# Patient Record
Sex: Female | Born: 1955 | Race: White | Hispanic: No | Marital: Married | Smoking: Former smoker
Health system: Southern US, Community
[De-identification: ages and names within clinical notes are randomized; demographics above are authoritative.]

## PROBLEM LIST (undated history)

## (undated) DIAGNOSIS — E7800 Pure hypercholesterolemia, unspecified: Secondary | ICD-10-CM

## (undated) DIAGNOSIS — E669 Obesity, unspecified: Secondary | ICD-10-CM

## (undated) DIAGNOSIS — I10 Essential (primary) hypertension: Secondary | ICD-10-CM

## (undated) DIAGNOSIS — G459 Transient cerebral ischemic attack, unspecified: Secondary | ICD-10-CM

## (undated) DIAGNOSIS — E119 Type 2 diabetes mellitus without complications: Secondary | ICD-10-CM

## (undated) HISTORY — PX: FINGER SURGERY: SHX640

## (undated) HISTORY — PX: TONSILLECTOMY AND ADENOIDECTOMY: SHX28

## (undated) HISTORY — DX: Type 2 diabetes mellitus without complications: E11.9

## (undated) HISTORY — DX: Pure hypercholesterolemia, unspecified: E78.00

---

## 1997-12-20 ENCOUNTER — Inpatient Hospital Stay (HOSPITAL_COMMUNITY): Admission: EM | Admit: 1997-12-20 | Discharge: 1997-12-22 | Payer: Self-pay | Admitting: Emergency Medicine

## 1998-12-18 ENCOUNTER — Other Ambulatory Visit: Admission: RE | Admit: 1998-12-18 | Discharge: 1998-12-18 | Payer: Self-pay | Admitting: Emergency Medicine

## 2001-01-09 ENCOUNTER — Other Ambulatory Visit: Admission: RE | Admit: 2001-01-09 | Discharge: 2001-01-09 | Payer: Self-pay | Admitting: Emergency Medicine

## 2001-01-14 ENCOUNTER — Encounter: Admission: RE | Admit: 2001-01-14 | Discharge: 2001-01-14 | Payer: Self-pay | Admitting: Emergency Medicine

## 2001-01-14 ENCOUNTER — Encounter: Payer: Self-pay | Admitting: Emergency Medicine

## 2003-04-20 ENCOUNTER — Emergency Department (HOSPITAL_COMMUNITY): Admission: EM | Admit: 2003-04-20 | Discharge: 2003-04-20 | Payer: Self-pay | Admitting: Emergency Medicine

## 2003-11-30 ENCOUNTER — Encounter: Admission: RE | Admit: 2003-11-30 | Discharge: 2003-11-30 | Payer: Self-pay | Admitting: Emergency Medicine

## 2005-08-07 ENCOUNTER — Encounter: Payer: Self-pay | Admitting: Emergency Medicine

## 2005-10-02 ENCOUNTER — Encounter: Admission: RE | Admit: 2005-10-02 | Discharge: 2005-10-02 | Payer: Self-pay | Admitting: Emergency Medicine

## 2006-10-01 ENCOUNTER — Encounter: Admission: RE | Admit: 2006-10-01 | Discharge: 2006-10-01 | Payer: Self-pay | Admitting: Emergency Medicine

## 2006-10-06 ENCOUNTER — Encounter: Admission: RE | Admit: 2006-10-06 | Discharge: 2006-10-06 | Payer: Self-pay | Admitting: Emergency Medicine

## 2006-10-15 ENCOUNTER — Encounter (INDEPENDENT_AMBULATORY_CARE_PROVIDER_SITE_OTHER): Payer: Self-pay | Admitting: Diagnostic Radiology

## 2006-10-15 ENCOUNTER — Encounter: Admission: RE | Admit: 2006-10-15 | Discharge: 2006-10-15 | Payer: Self-pay | Admitting: Emergency Medicine

## 2006-10-15 HISTORY — PX: BREAST BIOPSY: SHX20

## 2007-10-01 ENCOUNTER — Encounter: Admission: RE | Admit: 2007-10-01 | Discharge: 2007-10-01 | Payer: Self-pay | Admitting: Emergency Medicine

## 2008-09-29 ENCOUNTER — Encounter: Admission: RE | Admit: 2008-09-29 | Discharge: 2008-09-29 | Payer: Self-pay | Admitting: Family Medicine

## 2008-10-06 ENCOUNTER — Other Ambulatory Visit: Admission: RE | Admit: 2008-10-06 | Discharge: 2008-10-06 | Payer: Self-pay | Admitting: Family Medicine

## 2009-08-14 ENCOUNTER — Ambulatory Visit (HOSPITAL_COMMUNITY): Admission: RE | Admit: 2009-08-14 | Discharge: 2009-08-14 | Payer: Self-pay | Admitting: Surgery

## 2009-08-21 ENCOUNTER — Ambulatory Visit (HOSPITAL_COMMUNITY): Admission: RE | Admit: 2009-08-21 | Discharge: 2009-08-21 | Payer: Self-pay | Admitting: General Surgery

## 2009-09-12 ENCOUNTER — Ambulatory Visit (HOSPITAL_BASED_OUTPATIENT_CLINIC_OR_DEPARTMENT_OTHER): Admission: RE | Admit: 2009-09-12 | Discharge: 2009-09-12 | Payer: Self-pay | Admitting: Surgery

## 2009-09-28 ENCOUNTER — Encounter: Admission: RE | Admit: 2009-09-28 | Discharge: 2009-09-28 | Payer: Self-pay | Admitting: Family Medicine

## 2010-01-01 ENCOUNTER — Encounter: Admission: RE | Admit: 2010-01-01 | Discharge: 2010-01-05 | Payer: Self-pay | Admitting: Surgery

## 2010-04-29 ENCOUNTER — Encounter: Payer: Self-pay | Admitting: Emergency Medicine

## 2010-09-11 ENCOUNTER — Other Ambulatory Visit: Payer: Self-pay | Admitting: Family Medicine

## 2010-09-11 DIAGNOSIS — Z1231 Encounter for screening mammogram for malignant neoplasm of breast: Secondary | ICD-10-CM

## 2010-09-20 ENCOUNTER — Ambulatory Visit
Admission: RE | Admit: 2010-09-20 | Discharge: 2010-09-20 | Disposition: A | Discharge status: Home / Self Care | Payer: Managed Care, Other (non HMO) | Source: Ambulatory Visit | Attending: Family Medicine | Admitting: Family Medicine

## 2010-09-20 DIAGNOSIS — Z1231 Encounter for screening mammogram for malignant neoplasm of breast: Secondary | ICD-10-CM

## 2011-03-26 ENCOUNTER — Emergency Department (HOSPITAL_COMMUNITY): Payer: Managed Care, Other (non HMO)

## 2011-03-26 ENCOUNTER — Inpatient Hospital Stay (HOSPITAL_COMMUNITY)
Admission: AD | Admit: 2011-03-26 | Discharge: 2011-03-28 | DRG: 202 | Disposition: A | Discharge status: Home / Self Care | Payer: Managed Care, Other (non HMO) | Attending: Internal Medicine | Admitting: Internal Medicine

## 2011-03-26 ENCOUNTER — Other Ambulatory Visit: Payer: Self-pay

## 2011-03-26 DIAGNOSIS — E119 Type 2 diabetes mellitus without complications: Secondary | ICD-10-CM | POA: Diagnosis present

## 2011-03-26 DIAGNOSIS — Z23 Encounter for immunization: Secondary | ICD-10-CM

## 2011-03-26 DIAGNOSIS — F3289 Other specified depressive episodes: Secondary | ICD-10-CM | POA: Diagnosis present

## 2011-03-26 DIAGNOSIS — F329 Major depressive disorder, single episode, unspecified: Secondary | ICD-10-CM | POA: Diagnosis present

## 2011-03-26 DIAGNOSIS — R0600 Dyspnea, unspecified: Secondary | ICD-10-CM

## 2011-03-26 DIAGNOSIS — R739 Hyperglycemia, unspecified: Secondary | ICD-10-CM

## 2011-03-26 DIAGNOSIS — Z6841 Body Mass Index (BMI) 40.0 and over, adult: Secondary | ICD-10-CM

## 2011-03-26 DIAGNOSIS — I10 Essential (primary) hypertension: Secondary | ICD-10-CM | POA: Diagnosis present

## 2011-03-26 DIAGNOSIS — Z7982 Long term (current) use of aspirin: Secondary | ICD-10-CM

## 2011-03-26 DIAGNOSIS — E876 Hypokalemia: Secondary | ICD-10-CM | POA: Diagnosis present

## 2011-03-26 DIAGNOSIS — R0902 Hypoxemia: Secondary | ICD-10-CM | POA: Diagnosis present

## 2011-03-26 DIAGNOSIS — J45901 Unspecified asthma with (acute) exacerbation: Principal | ICD-10-CM | POA: Diagnosis present

## 2011-03-26 DIAGNOSIS — E669 Obesity, unspecified: Secondary | ICD-10-CM | POA: Diagnosis present

## 2011-03-26 HISTORY — DX: Essential (primary) hypertension: I10

## 2011-03-26 LAB — CBC
HCT: 39.8 % (ref 36.0–46.0)
Hemoglobin: 12.7 g/dL (ref 12.0–15.0)
MCH: 28.2 pg (ref 26.0–34.0)
MCHC: 31.9 g/dL (ref 30.0–36.0)
Platelets: 209 10*3/uL (ref 150–400)
RDW: 13.4 % (ref 11.5–15.5)
WBC: 9.3 10*3/uL (ref 4.0–10.5)

## 2011-03-26 LAB — BASIC METABOLIC PANEL WITH GFR
BUN: 8 mg/dL (ref 6–23)
CO2: 20 meq/L (ref 19–32)
Calcium: 9.3 mg/dL (ref 8.4–10.5)
Chloride: 97 meq/L (ref 96–112)
Creatinine, Ser: 0.55 mg/dL (ref 0.50–1.10)
GFR calc Af Amer: >90 mL/min
GFR calc non Af Amer: >90 mL/min
Glucose, Bld: 225 mg/dL — ABNORMAL HIGH (ref 70–99)
Potassium: 3.4 meq/L — ABNORMAL LOW (ref 3.5–5.1)
Sodium: 134 meq/L — ABNORMAL LOW (ref 135–145)

## 2011-03-26 LAB — D-DIMER, QUANTITATIVE: D-Dimer, Quant: 0.75 ug/mL-FEU — ABNORMAL HIGH (ref 0.00–0.48)

## 2011-03-26 LAB — PRO B NATRIURETIC PEPTIDE: Pro B Natriuretic peptide (BNP): 86.5 pg/mL (ref 0–125)

## 2011-03-26 MED ORDER — MAGNESIUM SULFATE 40 MG/ML IJ SOLN
2.0000 g | Freq: Once | INTRAMUSCULAR | Status: AC
  Administered 2011-03-26: 2 g via INTRAVENOUS
  Filled 2011-03-26: qty 50

## 2011-03-26 MED ORDER — METHYLPREDNISOLONE SODIUM SUCC 125 MG IJ SOLR
60.0000 mg | Freq: Four times a day (QID) | INTRAMUSCULAR | Status: DC
  Administered 2011-03-26: 18:00:00 via INTRAVENOUS
  Administered 2011-03-27 (×3): 60 mg via INTRAVENOUS
  Filled 2011-03-26 (×7): qty 2

## 2011-03-26 MED ORDER — PREDNISONE 20 MG PO TABS
50.0000 mg | ORAL_TABLET | Freq: Once | ORAL | Status: AC
  Administered 2011-03-26: 50 mg via ORAL
  Filled 2011-03-26: qty 3

## 2011-03-26 MED ORDER — ALBUTEROL SULFATE (5 MG/ML) 0.5% IN NEBU
INHALATION_SOLUTION | RESPIRATORY_TRACT | Status: AC
  Administered 2011-03-26: 5 mg
  Filled 2011-03-26: qty 1

## 2011-03-26 MED ORDER — IPRATROPIUM BROMIDE 0.02 % IN SOLN
RESPIRATORY_TRACT | Status: AC
  Administered 2011-03-26: 0.5 mg
  Filled 2011-03-26: qty 2.5

## 2011-03-26 MED ORDER — ACETAMINOPHEN 325 MG PO TABS
650.0000 mg | ORAL_TABLET | Freq: Once | ORAL | Status: AC
  Administered 2011-03-26: 650 mg via ORAL
  Filled 2011-03-26: qty 2

## 2011-03-26 MED ORDER — ALBUTEROL SULFATE (5 MG/ML) 0.5% IN NEBU
2.5000 mg | INHALATION_SOLUTION | Freq: Once | RESPIRATORY_TRACT | Status: AC
  Administered 2011-03-26: 2.5 mg via RESPIRATORY_TRACT
  Filled 2011-03-26: qty 0.5

## 2011-03-26 MED ORDER — IOHEXOL 350 MG/ML SOLN
80.0000 mL | Freq: Once | INTRAVENOUS | Status: AC | PRN
  Administered 2011-03-26: 80 mL via INTRAVENOUS

## 2011-03-26 MED ORDER — ALBUTEROL SULFATE (5 MG/ML) 0.5% IN NEBU
INHALATION_SOLUTION | RESPIRATORY_TRACT | Status: AC
  Administered 2011-03-26: 5 mg via RESPIRATORY_TRACT
  Filled 2011-03-26: qty 1

## 2011-03-26 NOTE — ED Notes (Signed)
Hx of asthma, developed a cough a few days ago, and getting worse.  Pt.s inhalers are not working

## 2011-03-26 NOTE — ED Notes (Signed)
Found patient in room extremely SOB. Breathing in 30s. 02 Sat 92% on RA. Pt placed on cardiac monitor. 2L nasal cannula administered. Hosmer, MD made aware of pt assessment. Resident at bedside to assess patient. Patient states she feels like an elephant is sitting on her chest.

## 2011-03-26 NOTE — ED Notes (Signed)
Ordered dinner tray; non sharps 

## 2011-03-26 NOTE — H&P (Signed)
PATIENT DETAILS Name: Laura Lam Age: 55 y.o. Sex: female Date of Birth: 07/07/1955 Admit Date: 03/26/2011 PCP: Dr. Laurine Blazer   CHIEF COMPLAINT:  Shortness of breath  HPI: The patient is a 55 year old female with a long-standing history of asthma presenting to the ED with 2 days of worsening cough associated to shortness of breath. This morning she felt she couldn't breathe and was brought to the emergency department by her husband. She was given 3 nebulizer treatments without good relief. She underwent a workup in the emergency department including d-dimer and CT PA and she ruled out for pulmonary embolism. Her chest x-ray did not find bronchitis or pneumonia. She was incidentally found with mildly enlarged cardiac silhouette without effusion. Her cardiac function was normal and she had no evidence of ischemia. The patient states that she was recently started on Dulera in addition to her Proventil. She normally does not use inhalers except during wintertime. She has no history of intubation due to asthma exacerbation. Her husband tells me that she has had asthma since she was a child. She denies any recent travel history or exposure to sick contacts. She is not exposed to fumes gases or chemicals. They do have several cats in the house but patient states they have had cats always.   ALLERGIES:   Allergies  Allergen Reactions  . Azithromycin Other (See Comments)    Stomach pain  . Demerol Other (See Comments)    Low bp    PAST MEDICAL HISTORY: Past Medical History  Diagnosis Date  . Asthma   . Hypertension     PAST SURGICAL HISTORY: History reviewed. No pertinent past surgical history.  MEDICATIONS AT HOME: Prior to Admission medications   Medication Sig Start Date End Date Taking? Authorizing Provider  aspirin EC 81 MG tablet Take 81 mg by mouth daily.     Yes Historical Provider, MD  losartan (COZAAR) 50 MG tablet Take 50 mg by mouth daily.     Yes Historical Provider, MD    PARoxetine (PAXIL) 20 MG tablet Take 20 mg by mouth every morning.     Yes Historical Provider, MD    FAMILY HISTORY: Ischemic heart disease and cancer. No history of asthma  SOCIAL HISTORY: Patient is married. Does not smoke or drink. Works in an office.  REVIEW OF SYSTEMS:  Constitutional:   No  weight loss, night sweats,  Fevers, chills, fatigue.  HEENT:    No headaches, Difficulty swallowing,Tooth/dental problems,Sore throat,  No sneezing, itching, ear ache, nasal congestion, post nasal drip,   Cardio-vascular: No chest pain,  Orthopnea, PND, swelling in lower extremities, anasarca,         dizziness, palpitations  GI:  No heartburn, indigestion, abdominal pain, nausea, vomiting, diarrhea, change in       bowel habits, loss of appetite  Resp: See history of present illness  Skin:  no rash or lesions.  GU:  no dysuria, change in color of urine, no urgency or frequency.  No flank pain.  Musculoskeletal: No joint pain or swelling.  No decreased range of motion.  No back pain.    PHYSICAL EXAM: Blood pressure 125/80, pulse 112, temperature 99.4 F (37.4 C), temperature source Oral, resp. rate 25, last menstrual period 02/22/2011, SpO2 96.00%.  General appearance :Awake, alert, slightly labored breathing HEENT: Atraumatic and Normocephalic, pupils equally reactive to light and accomodation Neck: supple, no JVD. No cervical lymphadenopathy.  Chest: Decreased air entry bilaterally. Inspiratory and expiratory wheezes. No rales CVS: S1 S2 regular,  no murmurs.  Abdomen: Bowel sounds present, Non tender and not distended with no gaurding, rigidity or rebound. Extremities: B/L Lower Ext shows no edema, both legs are warm to touch, with  dorsalis pedis pulses palpable. Neurology: Awake alert, and oriented X 3, CN II-XII intact, Non focal, Deep Tendon Reflex-2+ all over, plantar's downgoing B/L, sensory exam is grossly intact.  Skin:No Rash Wounds:N/A  LABS ON ADMISSION:    Basename 03/26/11 1036  NA 134*  K 3.4*  CL 97  CO2 20  GLUCOSE 225*  BUN 8  CREATININE 0.55  CALCIUM 9.3  MG --  PHOS --   No results found for this basename: AST:2,ALT:2,ALKPHOS:2,BILITOT:2,PROT:2,ALBUMIN:2 in the last 72 hours No results found for this basename: LIPASE:2,AMYLASE:2 in the last 72 hours  Basename 03/26/11 1036  WBC 9.3  NEUTROABS --  HGB 12.7  HCT 39.8  MCV 88.2  PLT 209   No results found for this basename: CKTOTAL:3,CKMB:3,CKMBINDEX:3,TROPONINI:3 in the last 72 hours  Basename 03/26/11 1036  DDIMER 0.75*   No components found with this basename: POCBNP:3   RADIOLOGIC STUDIES ON ADMISSION: Dg Chest 2 View  03/26/2011  *RADIOLOGY REPORT*  Clinical Data: Shortness of breath.  Cough.  CHEST - 2 VIEW  Comparison: 08/21/2009  Findings: Mild cardiomegaly.  Pulmonary vascularity is slightly prominent and the interstitial markings are slightly accentuated without consolidative infiltrates or effusions.  Chronic elevation of the right hemidiaphragm.  IMPRESSION: New mild cardiomegaly and slight pulmonary vascular prominence.  Original Report Authenticated By: Gwynn Burly, M.D.   Ct Angio Chest W/cm &/or Wo Cm  03/26/2011  *RADIOLOGY REPORT*  Clinical Data:  Shortness of breath, tachycardia, hypoxia, chest pain, elevated D-dimer  CT ANGIOGRAPHY CHEST WITH CONTRAST  Technique:  Multidetector CT imaging of the chest was performed using the standard protocol during bolus administration of intravenous contrast.  Multiplanar CT image reconstructions including MIPs were obtained to evaluate the vascular anatomy.  Contrast: 80mL OMNIPAQUE IOHEXOL 350 MG/ML IV SOLN  Comparison:  None  Findings: Liver appears mildly enlarged and low attenuation question fatty infiltration. Aorta upper normal caliber without aneurysm or dissection. Suboptimal opacification of the pulmonary arterial tree. No definite pulmonary emboli identified. Scattered normal-sized mediastinal lymph  nodes with a single minimally enlarged precarinal node 2.2 x 1.2 cm. Dependent atelectasis bilateral lower lobes. Remaining lungs clear. No pleural effusion or pneumothorax. No acute osseous findings.  Review of the MIP images confirms the above findings.  IMPRESSION: Dependent atelectasis bilateral lower lobes. No definite evidence of pulmonary embolism. Single nonspecific minimally enlarged precarinal lymph node. Probable diffuse fatty infiltration of liver.  Original Report Authenticated By: Lollie Marrow, M.D.    ASSESSMENT AND PLAN: Present on Admission:  .Asthma exacerbation .Hypokalemia .Hyperglycemia  Admit patient to MedSurg Oxygen therapy Solu-Medrol 60 mg every 6 hours Atrovent and Proventil nebs Magnesium sulfate 2 g IV over 20 minutes Replace potassium Insulin sliding scale Hemoglobin A1c Repeat electrolytes in a.m.   Further plan will depend as patient's clinical course evolves and further radiologic and laboratory data become available. Patient will be monitored closely.   DVT Prophylaxis: Lovenox  Code Status: Full code  Total time spent for admission equals 45 minutes.  Jonny Ruiz 03/26/2011, 5:42 PM

## 2011-03-26 NOTE — ED Notes (Signed)
[id number] READY

## 2011-03-26 NOTE — ED Notes (Signed)
Rert called to floor. They only have a semi-private room and the fact that the pt had a cough and a fever, the appropriateness of the room is being considered. Pt informed and nurse will call me back.

## 2011-03-26 NOTE — ED Notes (Signed)
Pt remains sob, pt refuses to wear nrb b/c of fear of enclosed spaces. [place] 02 increased to 6L

## 2011-03-26 NOTE — ED Provider Notes (Signed)
History     CSN: [id number] Arrival date & time: 03/26/2011  7:47 AM   First MD Initiated Contact with Patient 03/26/11 [phone]      Chief Complaint  Patient presents with  . Shortness of Breath    (Consider location/radiation/quality/duration/timing/severity/associated sxs/prior treatment) Patient is a 55 y.o. female presenting with shortness of breath. The history is provided by the patient.  Shortness of Breath  The current episode started yesterday. The onset was gradual. The problem occurs continuously. The problem has been gradually worsening. The problem is severe. The symptoms are relieved by nothing. The symptoms are aggravated by nothing. Associated symptoms include cough, shortness of breath and wheezing. Pertinent negatives include no chest pain, no chest pressure, no fever and no rhinorrhea. The cough is productive. Nothing relieves the cough. Her past medical history is significant for asthma. There were sick contacts at home. She has received no recent medical care.    Past Medical History  Diagnosis Date  . Asthma   . Hypertension     History reviewed. No pertinent past surgical history.  No family history on file.  History  Substance Use Topics  . Smoking status: Never Smoker   . Smokeless tobacco: Not on file  . Alcohol Use: No    OB History    Grav Para Term Preterm Abortions TAB SAB Ect Mult Living                  Review of Systems  Constitutional: Negative for fever and chills.  HENT: Positive for congestion. Negative for rhinorrhea.   Respiratory: Positive for cough, shortness of breath and wheezing.   Cardiovascular: Negative for chest pain and palpitations.  Gastrointestinal: Negative for nausea, vomiting and abdominal pain.  Musculoskeletal: Negative for back pain.  Skin: Negative for color change and rash.  Neurological: Negative for light-headedness and headaches.  All other systems reviewed and are negative.    Allergies  Azithromycin  and Demerol  Home Medications   Current Outpatient Rx  Name Route Sig Dispense Refill  . ASPIRIN EC 81 MG PO TBEC Oral Take 81 mg by mouth daily.      Marland Kitchen PAXIL PO Oral Take 1 tablet by mouth daily.      Marland Kitchen PRESCRIPTION MEDICATION Oral Take 1 tablet by mouth daily. Blood pressure medication       BP 150/90  Pulse 132  Temp(Src) 100.5 F (38.1 C) (Oral)  Resp 32  SpO2 93%  LMP 02/22/2011  Physical Exam  Nursing note and vitals reviewed. Constitutional: She is oriented to person, place, and time. She appears well-developed and well-nourished.  HENT:  Head: Normocephalic and atraumatic.  Eyes: Pupils are equal, round, and reactive to light.  Cardiovascular: Regular rhythm, normal heart sounds and intact distal pulses.  Tachycardia present.   Pulmonary/Chest: She is in respiratory distress (mild, tachypneic, speaks in short sentences). She has wheezes.  Abdominal: Soft. She exhibits no distension. There is no tenderness.  Neurological: She is alert and oriented to person, place, and time.  Skin: Skin is warm and dry.  Psychiatric: She has a normal mood and affect.    ED Course  Procedures (including critical care time)  Labs Reviewed  BASIC METABOLIC PANEL - Abnormal; Notable for the following:    Sodium 134 (*)    Potassium 3.4 (*)    Glucose, Bld 225 (*)    All other components within normal limits  D-DIMER, QUANTITATIVE - Abnormal; Notable for the following:    D-Dimer,  Quant 0.75 (*)    All other components within normal limits  CBC  PRO B NATRIURETIC PEPTIDE   Dg Chest 2 View  03/26/2011  *RADIOLOGY REPORT*  Clinical Data: Shortness of breath.  Cough.  CHEST - 2 VIEW  Comparison: 08/21/2009  Findings: Mild cardiomegaly.  Pulmonary vascularity is slightly prominent and the interstitial markings are slightly accentuated without consolidative infiltrates or effusions.  Chronic elevation of the right hemidiaphragm.  IMPRESSION: New mild cardiomegaly and slight pulmonary  vascular prominence.  Original Report Authenticated By: Gwynn Burly, M.D.   Ct Angio Chest W/cm &/or Wo Cm  03/26/2011  *RADIOLOGY REPORT*  Clinical Data:  Shortness of breath, tachycardia, hypoxia, chest pain, elevated D-dimer  CT ANGIOGRAPHY CHEST WITH CONTRAST  Technique:  Multidetector CT imaging of the chest was performed using the standard protocol during bolus administration of intravenous contrast.  Multiplanar CT image reconstructions including MIPs were obtained to evaluate the vascular anatomy.  Contrast: 80mL OMNIPAQUE IOHEXOL 350 MG/ML IV SOLN  Comparison:  None  Findings: Liver appears mildly enlarged and low attenuation question fatty infiltration. Aorta upper normal caliber without aneurysm or dissection. Suboptimal opacification of the pulmonary arterial tree. No definite pulmonary emboli identified. Scattered normal-sized mediastinal lymph nodes with a single minimally enlarged precarinal node 2.2 x 1.2 cm. Dependent atelectasis bilateral lower lobes. Remaining lungs clear. No pleural effusion or pneumothorax. No acute osseous findings.  Review of the MIP images confirms the above findings.  IMPRESSION: Dependent atelectasis bilateral lower lobes. No definite evidence of pulmonary embolism. Single nonspecific minimally enlarged precarinal lymph node. Probable diffuse fatty infiltration of liver.  Original Report Authenticated By: Lollie Marrow, M.D.     Date: 03/26/2011  Rate: 135  Rhythm: normal sinus rhythm and PVCs  QRS Axis: left  Intervals: normal  ST/T Wave abnormalities: normal  Conduction Disutrbances:none  Narrative Interpretation: NSR, PVCs, no signs of acute ischemia  Old EKG Reviewed: unchanged (08/21/09)   1. Dyspnea   2. Hypoxia   3. Asthma exacerbation       MDM  Significantly her female history of asthma, which she states is worse in the winter, who states her PCP does not have her on any medications at home began having cough and congestion several  days ago, and began having problems with her asthma last night. Her breathing troubles worse, so she came to the ED this morning. She denies any fevers at home, but does say she has had a productive cough, and occasional chest tightness with deep breaths. She has no other complaints. Currently she has a temperature of 100.5, is tachycardic and tachypneic, with borderline low O2 sats, and is in mild respiratory distress, only able to speak short sentences at a time. She has diffuse wheezing noted throughout all lung fields. Remainder of exam is unremarkable. Due to her URI symptoms and current fever, we will get a chest x-ray to evaluate for possible underlying pneumonia, and treat her symptoms.  Patient states a mild improvement of her shortness of breath at this time following her albuterol treatment. She does have a skin improvement in the wheezing heard on exam. Notified the nurse that the patient was complaining of chest pain, however when talking with the patient she was saying that she was continuing to feel the same intermittent tightness while breathing but more frequently than before. She is denying any pain or pressure, or any continual discomfort. Patient is not having any accessory muscle use, and is able to speak in full  sentences, though requiring slightly more oxygen for comfort. Chest x-ray without any acute findings. D2 resolution of wheezing, no findings on chest x-ray, will check basic lab work to evaluate for other possible under lying etiology, including possible CHF versus PE. Patient without any actual chest pain, do not think there is any ACS component to this.  Patient with elevated d-dimer. BNP normal, and other labs unremarkable. Will get a CT angiogram to evaluate for possible PE.  CT PE is unremarkable for acute findings. Patient still subjective dyspnea, though without any accessory muscle use. She is maintaining her sats well in the upper 90s on 4 L of oxygen by nasal cannula.  She has very trace wheezing at this time, and good air movement throughout patient was ambulated by nursing staff, with a drop of her O2 sats to as low as 89. Other than her asthma, patient has no other diagnosed lung problems, and has never before required oxygen. The patient with on call hospitalist, and they will admit the patient for further evaluation.    Theotis Burrow, MD 03/26/11 [phone]

## 2011-03-26 NOTE — ED Notes (Signed)
3019-01 ready

## 2011-03-26 NOTE — ED Notes (Addendum)
Pt states she still feels SOB. Pt sitting in bed tripoding. Current vitals HR 127, RR 28, BP 127/57, SPO2 92% on 2L [place]. June Leap, MD Resident made aware pt still not feeling any better. June Leap, MD Resident states, "I'm just waiting on her chest xray. I looked at it and wasn't too impressed but they still haven't read it." I reiterated that the patient wasn't feeling well enough to go home, pt complaining of pain in her chest and pt still SOB. No new orders

## 2011-03-26 NOTE — ED Provider Notes (Signed)
I have seen and examined this patient with the resident.  I agree with the resident's note, assessment and plan except as indicated.     Nat Christen, MD 03/26/11 [phone]

## 2011-03-26 NOTE — Progress Notes (Signed)
Spoke with patient and family.  Offered emotional support.  Patient was having trouble breathing which made conversation difficult.  Laura Lam 10:14 AM  03/26/11 1000  Clinical Encounter Type  Visited With Patient;Family  Visit Type Initial

## 2011-03-27 ENCOUNTER — Encounter (HOSPITAL_COMMUNITY): Payer: Self-pay

## 2011-03-27 LAB — BASIC METABOLIC PANEL
BUN: 9 mg/dL (ref 6–23)
GFR calc Af Amer: >90 mL/min (ref >90)
GFR calc non Af Amer: >90 mL/min (ref >90)
Potassium: 3.9 mEq/L (ref 3.5–5.1)
Sodium: 131 mEq/L — ABNORMAL LOW (ref 135–145)

## 2011-03-27 LAB — HEMOGLOBIN A1C
Hgb A1c MFr Bld: 7.1 % — ABNORMAL HIGH (ref <5.7)
Mean Plasma Glucose: 154 mg/dL — ABNORMAL HIGH (ref <117)

## 2011-03-27 LAB — GLUCOSE, CAPILLARY: Glucose-Capillary: 209 mg/dL — ABNORMAL HIGH (ref 70–99)

## 2011-03-27 LAB — CBC
HCT: 38.3 % (ref 36.0–46.0)
Hemoglobin: 12.8 g/dL (ref 12.0–15.0)
RDW: 13.3 % (ref 11.5–15.5)
WBC: 8.5 10*3/uL (ref 4.0–10.5)

## 2011-03-27 MED ORDER — DOCUSATE SODIUM 100 MG PO CAPS
100.0000 mg | ORAL_CAPSULE | Freq: Two times a day (BID) | ORAL | Status: DC
  Administered 2011-03-27 – 2011-03-28 (×3): 100 mg via ORAL
  Filled 2011-03-27 (×2): qty 1

## 2011-03-27 MED ORDER — ASPIRIN EC 81 MG PO TBEC
81.0000 mg | DELAYED_RELEASE_TABLET | Freq: Every day | ORAL | Status: DC
  Administered 2011-03-28: 81 mg via ORAL
  Filled 2011-03-27 (×2): qty 1

## 2011-03-27 MED ORDER — SODIUM CHLORIDE 0.9 % IJ SOLN
3.0000 mL | INTRAMUSCULAR | Status: DC | PRN
  Administered 2011-03-27: 3 mL via INTRAVENOUS

## 2011-03-27 MED ORDER — IPRATROPIUM BROMIDE 0.02 % IN SOLN
0.5000 mg | Freq: Four times a day (QID) | RESPIRATORY_TRACT | Status: DC
  Administered 2011-03-27 (×4): 0.5 mg via RESPIRATORY_TRACT
  Filled 2011-03-27 (×4): qty 2.5

## 2011-03-27 MED ORDER — SODIUM CHLORIDE 0.9 % IJ SOLN
3.0000 mL | Freq: Two times a day (BID) | INTRAMUSCULAR | Status: DC
  Administered 2011-03-27 – 2011-03-28 (×3): 3 mL via INTRAVENOUS

## 2011-03-27 MED ORDER — ALBUTEROL SULFATE (5 MG/ML) 0.5% IN NEBU
2.5000 mg | INHALATION_SOLUTION | Freq: Two times a day (BID) | RESPIRATORY_TRACT | Status: DC
  Administered 2011-03-28: 2.5 mg via RESPIRATORY_TRACT
  Filled 2011-03-27: qty 0.5

## 2011-03-27 MED ORDER — LOSARTAN POTASSIUM 50 MG PO TABS
50.0000 mg | ORAL_TABLET | Freq: Every day | ORAL | Status: DC
  Administered 2011-03-27 – 2011-03-28 (×2): 50 mg via ORAL
  Filled 2011-03-27 (×2): qty 1

## 2011-03-27 MED ORDER — ALBUTEROL SULFATE (5 MG/ML) 0.5% IN NEBU
2.5000 mg | INHALATION_SOLUTION | Freq: Four times a day (QID) | RESPIRATORY_TRACT | Status: DC
  Administered 2011-03-27 (×4): 2.5 mg via RESPIRATORY_TRACT
  Filled 2011-03-27 (×4): qty 0.5

## 2011-03-27 MED ORDER — SODIUM CHLORIDE 0.9 % IV SOLN: 250.0000 mL | INTRAVENOUS | Status: DC | PRN

## 2011-03-27 MED ORDER — INSULIN ASPART 100 UNIT/ML ~~LOC~~ SOLN
0.0000 [IU] | Freq: Every day | SUBCUTANEOUS | Status: DC
  Administered 2011-03-27: 2 [IU] via SUBCUTANEOUS
  Filled 2011-03-27: qty 3

## 2011-03-27 MED ORDER — ALUM & MAG HYDROXIDE-SIMETH 200-200-20 MG/5ML PO SUSP: 30.0000 mL | Freq: Four times a day (QID) | ORAL | Status: DC | PRN

## 2011-03-27 MED ORDER — TRAZODONE HCL 50 MG PO TABS
50.0000 mg | ORAL_TABLET | Freq: Every evening | ORAL | Status: DC | PRN
  Administered 2011-03-27: 50 mg via ORAL
  Filled 2011-03-27: qty 1

## 2011-03-27 MED ORDER — POTASSIUM CHLORIDE CRYS ER 20 MEQ PO TBCR
40.0000 meq | EXTENDED_RELEASE_TABLET | Freq: Two times a day (BID) | ORAL | Status: AC
  Administered 2011-03-27 (×2): 40 meq via ORAL
  Filled 2011-03-27 (×3): qty 2

## 2011-03-27 MED ORDER — ALBUTEROL SULFATE (5 MG/ML) 0.5% IN NEBU: 2.5000 mg | INHALATION_SOLUTION | RESPIRATORY_TRACT | Status: DC | PRN

## 2011-03-27 MED ORDER — PAROXETINE HCL 20 MG PO TABS
20.0000 mg | ORAL_TABLET | ORAL | Status: DC
  Administered 2011-03-27 – 2011-03-28 (×2): 20 mg via ORAL
  Filled 2011-03-27 (×4): qty 1

## 2011-03-27 MED ORDER — ACETAMINOPHEN 325 MG PO TABS
650.0000 mg | ORAL_TABLET | Freq: Four times a day (QID) | ORAL | Status: DC | PRN
  Administered 2011-03-27 (×2): 650 mg via ORAL
  Filled 2011-03-27 (×2): qty 2

## 2011-03-27 MED ORDER — METHYLPREDNISOLONE SODIUM SUCC 40 MG IJ SOLR
40.0000 mg | Freq: Four times a day (QID) | INTRAMUSCULAR | Status: DC
  Administered 2011-03-27 – 2011-03-28 (×4): 40 mg via INTRAVENOUS
  Filled 2011-03-27 (×7): qty 1

## 2011-03-27 MED ORDER — ENOXAPARIN SODIUM 40 MG/0.4ML ~~LOC~~ SOLN
40.0000 mg | SUBCUTANEOUS | Status: DC
  Administered 2011-03-27 – 2011-03-28 (×2): 40 mg via SUBCUTANEOUS
  Filled 2011-03-27 (×3): qty 0.4

## 2011-03-27 MED ORDER — INSULIN ASPART 100 UNIT/ML ~~LOC~~ SOLN
6.0000 [IU] | Freq: Three times a day (TID) | SUBCUTANEOUS | Status: DC
  Administered 2011-03-27 – 2011-03-28 (×5): 6 [IU] via SUBCUTANEOUS
  Filled 2011-03-27: qty 3

## 2011-03-27 MED ORDER — ASPIRIN EC 81 MG PO TBEC
81.0000 mg | DELAYED_RELEASE_TABLET | Freq: Every day | ORAL | Status: DC
  Administered 2011-03-27: 81 mg via ORAL
  Filled 2011-03-27 (×2): qty 1

## 2011-03-27 MED ORDER — INSULIN ASPART 100 UNIT/ML ~~LOC~~ SOLN
0.0000 [IU] | Freq: Three times a day (TID) | SUBCUTANEOUS | Status: DC
  Administered 2011-03-27: 11 [IU] via SUBCUTANEOUS
  Administered 2011-03-27 – 2011-03-28 (×4): 7 [IU] via SUBCUTANEOUS
  Filled 2011-03-27: qty 3

## 2011-03-27 MED ORDER — IPRATROPIUM BROMIDE 0.02 % IN SOLN
0.5000 mg | Freq: Two times a day (BID) | RESPIRATORY_TRACT | Status: DC
  Administered 2011-03-28: 0.5 mg via RESPIRATORY_TRACT
  Filled 2011-03-27: qty 2.5

## 2011-03-27 MED ORDER — PANTOPRAZOLE SODIUM 40 MG PO TBEC
40.0000 mg | DELAYED_RELEASE_TABLET | Freq: Every day | ORAL | Status: DC
  Administered 2011-03-27 – 2011-03-28 (×2): 40 mg via ORAL
  Filled 2011-03-27: qty 1

## 2011-03-27 MED ORDER — ACETAMINOPHEN 650 MG RE SUPP: 650.0000 mg | Freq: Four times a day (QID) | RECTAL | Status: DC | PRN

## 2011-03-27 NOTE — Progress Notes (Signed)
Utilization Review Completed.Laura Lam T12/19/2012   

## 2011-03-27 NOTE — Progress Notes (Signed)
Subjective: Breathing better  Objective: Vital signs in last 24 hours: Temp:  [97.8 F (36.6 C)-98.8 F (37.1 C)] 98.8 F (37.1 C) (12/19 1434) Pulse Rate:  [95-104] 100  (12/19 1434) Resp:  [20-22] 20  (12/19 0600) BP: (111-153)/(67-124) 115/76 mmHg (12/19 1434) SpO2:  [90 %-100 %] 90 % (12/19 1452) FiO2 (%):  [92 %] 92 % (12/18 1827) Weight:  [135.626 kg (299 lb)-142.9 kg (315 lb 0.6 oz)] 299 lb (135.626 kg) (12/19 0604) Weight change:  Last BM Date: 03/27/11  Intake/Output from previous day:   Physical Exam: General: Alert, awake, oriented x3, in no acute distress. HEENT: No bruits, no goiter. Heart: Regular rate and rhythm, without murmurs, rubs, gallops. Lungs: Clear to auscultation bilaterally. Abdomen: Soft, nontender, nondistended, positive bowel sounds. Extremities: No clubbing cyanosis or edema with positive pedal pulses. Neuro: Grossly intact, nonfocal.  Lab Results: Basic Metabolic Panel:  Basename 03/27/11 0110 03/26/11 1036  NA 131* 134*  K 3.9 3.4*  CL 97 97  CO2 23 20  GLUCOSE 230* 225*  BUN 9 8  CREATININE 0.54 0.55  CALCIUM 9.1 9.3  MG -- --  PHOS -- --   Liver Function Tests: No results found for this basename: AST:2,ALT:2,ALKPHOS:2,BILITOT:2,PROT:2,ALBUMIN:2 in the last 72 hours No results found for this basename: LIPASE:2,AMYLASE:2 in the last 72 hours No results found for this basename: AMMONIA:2 in the last 72 hours CBC:  Basename 03/27/11 0110 03/26/11 1036  WBC 8.5 9.3  NEUTROABS -- --  HGB 12.8 12.7  HCT 38.3 39.8  MCV 87.6 88.2  PLT 238 209   Cardiac Enzymes: No results found for this basename: CKTOTAL:3,CKMB:3,CKMBINDEX:3,TROPONINI:3 in the last 72 hours BNP: No components found with this basename: POCBNP:3 D-Dimer:  Basename 03/26/11 1036  DDIMER 0.75*   CBG:  Basename 03/27/11 1135 03/27/11 0624 03/27/11 0028  GLUCAP 287* 226* 245*   Hemoglobin A1C:  Basename 03/27/11 0110  HGBA1C 7.0*   Fasting Lipid  Panel: No results found for this basename: CHOL,HDL,LDLCALC,TRIG,CHOLHDL,LDLDIRECT in the last 72 hours Thyroid Function Tests: No results found for this basename: TSH,T4TOTAL,FREET4,T3FREE,THYROIDAB in the last 72 hours Anemia Panel: No results found for this basename: VITAMINB12,FOLATE,FERRITIN,TIBC,IRON,RETICCTPCT in the last 72 hours Coagulation: No results found for this basename: LABPROT:2,INR:2 in the last 72 hours Urine Drug Screen: Drugs of Abuse  No results found for this basename: labopia, cocainscrnur, labbenz, amphetmu, thcu, labbarb    Alcohol Level: No results found for this basename: ETH:2 in the last 72 hours  No results found for this or any previous visit (from the past 240 hour(s)).  Studies/Results: Dg Chest 2 View  03/26/2011  *RADIOLOGY REPORT*  Clinical Data: Shortness of breath.  Cough.  CHEST - 2 VIEW  Comparison: 08/21/2009  Findings: Mild cardiomegaly.  Pulmonary vascularity is slightly prominent and the interstitial markings are slightly accentuated without consolidative infiltrates or effusions.  Chronic elevation of the right hemidiaphragm.  IMPRESSION: New mild cardiomegaly and slight pulmonary vascular prominence.  Original Report Authenticated By: Gwynn Burly, M.D.   Ct Angio Chest W/cm &/or Wo Cm  03/26/2011  *RADIOLOGY REPORT*  Clinical Data:  Shortness of breath, tachycardia, hypoxia, chest pain, elevated D-dimer  CT ANGIOGRAPHY CHEST WITH CONTRAST  Technique:  Multidetector CT imaging of the chest was performed using the standard protocol during bolus administration of intravenous contrast.  Multiplanar CT image reconstructions including MIPs were obtained to evaluate the vascular anatomy.  Contrast: 80mL OMNIPAQUE IOHEXOL 350 MG/ML IV SOLN  Comparison:  None  Findings: Liver  appears mildly enlarged and low attenuation question fatty infiltration. Aorta upper normal caliber without aneurysm or dissection. Suboptimal opacification of the pulmonary  arterial tree. No definite pulmonary emboli identified. Scattered normal-sized mediastinal lymph nodes with a single minimally enlarged precarinal node 2.2 x 1.2 cm. Dependent atelectasis bilateral lower lobes. Remaining lungs clear. No pleural effusion or pneumothorax. No acute osseous findings.  Review of the MIP images confirms the above findings.  IMPRESSION: Dependent atelectasis bilateral lower lobes. No definite evidence of pulmonary embolism. Single nonspecific minimally enlarged precarinal lymph node. Probable diffuse fatty infiltration of liver.  Original Report Authenticated By: Lollie Marrow, M.D.    Medications: Scheduled Meds:   . albuterol  2.5 mg Nebulization Q6H  . aspirin EC  81 mg Oral Daily  . aspirin EC  81 mg Oral Daily  . docusate sodium  100 mg Oral BID  . enoxaparin  40 mg Subcutaneous Q24H  . insulin aspart  0-20 Units Subcutaneous TID WC  . insulin aspart  0-5 Units Subcutaneous QHS  . insulin aspart  6 Units Subcutaneous TID WC  . ipratropium  0.5 mg Nebulization Q6H  . losartan  50 mg Oral Daily  . magnesium sulfate 1 - 4 g bolus IVPB  2 g Intravenous Once  . methylPREDNISolone (SOLU-MEDROL) injection  60 mg Intravenous Q6H  . pantoprazole  40 mg Oral Q1200  . PARoxetine  20 mg Oral Q0700  . potassium chloride  40 mEq Oral BID  . sodium chloride  3 mL Intravenous Q12H   Continuous Infusions:  PRN Meds:.sodium chloride, acetaminophen, acetaminophen, albuterol, alum & mag hydroxide-simeth, sodium chloride, traZODone  Assessment/Plan: 1.Asthma exacerbation: wean steroids, continue atrovent and albuterol nebs, wean O2 2. Hypokalemia: replace 3. Hyperglycemia: check Hb aic, steroids contributing, previously borderline diabetic    LOS: 1 day   [facility] Pager: [phone] 03/27/2011, 6:01 PM

## 2011-03-28 DIAGNOSIS — E119 Type 2 diabetes mellitus without complications: Secondary | ICD-10-CM | POA: Diagnosis present

## 2011-03-28 DIAGNOSIS — E669 Obesity, unspecified: Secondary | ICD-10-CM | POA: Diagnosis present

## 2011-03-28 LAB — GLUCOSE, CAPILLARY: Glucose-Capillary: 206 mg/dL — ABNORMAL HIGH (ref 70–99)

## 2011-03-28 MED ORDER — ALBUTEROL SULFATE (5 MG/ML) 0.5% IN NEBU: 2.5000 mg | INHALATION_SOLUTION | RESPIRATORY_TRACT | Status: DC | PRN

## 2011-03-28 MED ORDER — PREDNISONE (PAK) 10 MG PO TABS: 10.0000 mg | ORAL_TABLET | Freq: Every day | ORAL | Status: AC

## 2011-03-28 MED ORDER — METFORMIN HCL 500 MG PO TABS: 500.0000 mg | ORAL_TABLET | Freq: Two times a day (BID) | ORAL | Status: DC

## 2011-03-28 MED ORDER — LIVING WELL WITH DIABETES BOOK
Freq: Once | Status: DC
  Filled 2011-03-28: qty 1

## 2011-03-28 MED ORDER — PNEUMOCOCCAL VAC POLYVALENT 25 MCG/0.5ML IJ INJ
0.5000 mL | INJECTION | INTRAMUSCULAR | Status: DC
  Filled 2011-03-28 (×2): qty 0.5

## 2011-03-28 MED ORDER — MOMETASONE FURO-FORMOTEROL FUM 200-5 MCG/ACT IN AERO: 1.0000 | INHALATION_SPRAY | Freq: Two times a day (BID) | RESPIRATORY_TRACT | Status: DC

## 2011-03-28 NOTE — Discharge Summary (Signed)
Physician Discharge Summary  Patient ID: Laura Lam MRN: [id number] DOB/AGE: 10-07-1955 55 y.o.  Admit date: 03/26/2011 Discharge date: 03/28/2011  Primary Care Physician: Dr.Sharon Laurine Lam  Discharge Diagnoses:   1.*Asthma exacerbation 2. Hypokalemia 3. New onset : Diabetes mellitus 4. Obesity 5. depression   Discharge Medication List as of 03/28/2011  2:28 PM    START taking these medications   Details  albuterol (PROVENTIL) (5 MG/ML) 0.5% nebulizer solution Take 0.5 mLs (2.5 mg total) by nebulization every 4 (four) hours as needed for wheezing., Starting 03/28/2011, Until Fri 03/27/12, Normal    metFORMIN (GLUCOPHAGE) 500 MG tablet Take 1 tablet (500 mg total) by mouth 2 (two) times daily with a meal., Starting 03/28/2011, Until Fri 03/27/12, Normal    Mometasone Furo-Formoterol Fum 200-5 MCG/ACT AERO Inhale 1 puff into the lungs 2 (two) times daily., Starting 03/28/2011, Until Discontinued, No Print    predniSONE (STERAPRED UNI-PAK) 10 MG tablet Take 1 tablet (10 mg total) by mouth daily. Take 4 tabs today then 3 tabs for 1day then 2 tabs for 1 day then 1tab for 1 day then STOP, Starting 03/28/2011, Until Sun 04/07/11, Normal      CONTINUE these medications which have NOT CHANGED   Details  aspirin EC 81 MG tablet Take 81 mg by mouth daily.  , Until Discontinued, Historical Med    losartan (COZAAR) 50 MG tablet Take 50 mg by mouth daily.  , Until Discontinued, Historical Med    PARoxetine (PAXIL) 20 MG tablet Take 20 mg by mouth every morning.  , Until Discontinued, Historical Med         Disposition and Follow-up:  PCP in 1 week  Consults:none   Significant Diagnostic Studies:  Dg Chest 2 View  03/26/2011  *RADIOLOGY REPORT*  Clinical Data: Shortness of breath.  Cough.  CHEST - 2 VIEW  Comparison: 08/21/2009  Findings: Mild cardiomegaly.  Pulmonary vascularity is slightly prominent and the interstitial markings are slightly accentuated without consolidative  infiltrates or effusions.  Chronic elevation of the right hemidiaphragm.  IMPRESSION: New mild cardiomegaly and slight pulmonary vascular prominence.  Original Report Authenticated By: Gwynn Burly, M.D.   Ct Angio Chest W/cm &/or Wo Cm  03/26/2011  *RADIOLOGY REPORT*  Clinical Data:  Shortness of breath, tachycardia, hypoxia, chest pain, elevated D-dimer  CT ANGIOGRAPHY CHEST WITH CONTRAST  Technique:  Multidetector CT imaging of the chest was performed using the standard protocol during bolus administration of intravenous contrast.  Multiplanar CT image reconstructions including MIPs were obtained to evaluate the vascular anatomy.  Contrast: 80mL OMNIPAQUE IOHEXOL 350 MG/ML IV SOLN  Comparison:  None  Findings: Liver appears mildly enlarged and low attenuation question fatty infiltration. Aorta upper normal caliber without aneurysm or dissection. Suboptimal opacification of the pulmonary arterial tree. No definite pulmonary emboli identified. Scattered normal-sized mediastinal lymph nodes with a single minimally enlarged precarinal node 2.2 x 1.2 cm. Dependent atelectasis bilateral lower lobes. Remaining lungs clear. No pleural effusion or pneumothorax. No acute osseous findings.  Review of the MIP images confirms the above findings.  IMPRESSION: Dependent atelectasis bilateral lower lobes. No definite evidence of pulmonary embolism. Single nonspecific minimally enlarged precarinal lymph node. Probable diffuse fatty infiltration of liver.  Original Report Authenticated By: Lollie Marrow, M.D.    Brief H and P: [name] is a 55 year old female with long-standing history of asthma presented to the hospital with two-day history of worsening cough shortness of breath and wheezing she presented to the emergency room where  her workup included a chest x-ray which is unremarkable as well as a CT angiogram and was subsequently admitted for management of her asthma exacerbation  Hospital Course:  1.Asthma  exacerbation: This was treated with round-the-clock nebulization, along with IV steroids and oxygen. She improved clinically and was transitioned to oral steroids, albuterol nebulization and restarted on her dulera 2. Diabetes mellitus: Her diagnosis her sugars were on admission were found to be in the low 200s which was originally attributed to IV steroids however given her history of borderline diabetes check hemoglobin A1c and was noted to be 7.1 consistent with a diagnosis of diabetes hence was seen by a diabetic with diabetic coordinator  set up for outpatient  Education, also started on metformin, instructed about weight loss and lifestyle modification. 3. based on her initial presentation, her EKG on admission did show nonspecific T wave abnormality, did not have any chest pain and followup 2-D echo done did not show any wall motion abnormalities were evidence of valvular disease, EF was normal.  Time spent on Discharge: SignedZannie Lam [facility] Pager: [phone] 03/28/2011, 4:32 PM

## 2011-03-28 NOTE — Progress Notes (Signed)
  Echocardiogram 2D Echocardiogram has been performed.  [name] 03/28/2011, 9:36 AM

## 2011-03-28 NOTE — Progress Notes (Signed)
Inpatient Diabetes Program Recommendations  AACE/ADA: New Consensus Statement on Inpatient Glycemic Control (2009)  Target Ranges:  Prepandial:   less than 140 mg/dL      Peak postprandial:   less than 180 mg/dL (1-2 hours)      Critically ill patients:  140 - 180 mg/dL   Reason: consult  Inpatient Diabetes Program Recommendations Oral Agents: Metformin 500mg  HgbA1C: =7  Note: Patient has been told in the past that she had "borderline" DM.  Patient is ready to make lifestyle modifications for glucose control.  Suggested patient monitor CBGs at least once daily.  Gave patient information for [facility] classes and free diabetes support group.  Patient and daughter were preparing to watch diabetes basics video on patient education network.   Thank you Piedad Climes RN, BSN, CDE  Diabetes Coordinator

## 2011-05-28 ENCOUNTER — Other Ambulatory Visit (HOSPITAL_COMMUNITY)
Admission: RE | Admit: 2011-05-28 | Discharge: 2011-05-28 | Disposition: A | Discharge status: Home / Self Care | Payer: Managed Care, Other (non HMO) | Source: Ambulatory Visit | Attending: Family Medicine | Admitting: Family Medicine

## 2011-05-28 ENCOUNTER — Other Ambulatory Visit: Payer: Self-pay | Admitting: Family Medicine

## 2011-05-28 DIAGNOSIS — Z1159 Encounter for screening for other viral diseases: Secondary | ICD-10-CM | POA: Insufficient documentation

## 2011-05-28 DIAGNOSIS — Z124 Encounter for screening for malignant neoplasm of cervix: Secondary | ICD-10-CM | POA: Insufficient documentation

## 2011-06-26 ENCOUNTER — Other Ambulatory Visit: Payer: Self-pay | Admitting: Obstetrics and Gynecology

## 2011-08-26 ENCOUNTER — Other Ambulatory Visit: Payer: Self-pay | Admitting: Family Medicine

## 2011-08-26 DIAGNOSIS — Z1231 Encounter for screening mammogram for malignant neoplasm of breast: Secondary | ICD-10-CM

## 2011-09-19 ENCOUNTER — Ambulatory Visit
Admission: RE | Admit: 2011-09-19 | Discharge: 2011-09-19 | Disposition: A | Discharge status: Home / Self Care | Payer: Managed Care, Other (non HMO) | Source: Ambulatory Visit | Attending: Family Medicine | Admitting: Family Medicine

## 2011-09-19 DIAGNOSIS — Z1231 Encounter for screening mammogram for malignant neoplasm of breast: Secondary | ICD-10-CM

## 2012-03-12 IMAGING — RF DG UGI W/ KUB
19 of 24 series · 19 of 24 positions shown · non-contrast
Comparison: Chest radiograph 08/21/2009.

CLINICAL DATA: Morbid obesity.  The patient reports no concerns
with stomach or esophagus and denies history of reflux.

UPPER GI SERIES WITH KUB
TECHNIQUE: Routine upper GI series was performed with thin barium.
Fluoroscopy Time: 3.0 minutes

[Series 1: run · 1 of 1 slices shown (1 of 19)]
[im 1/1]
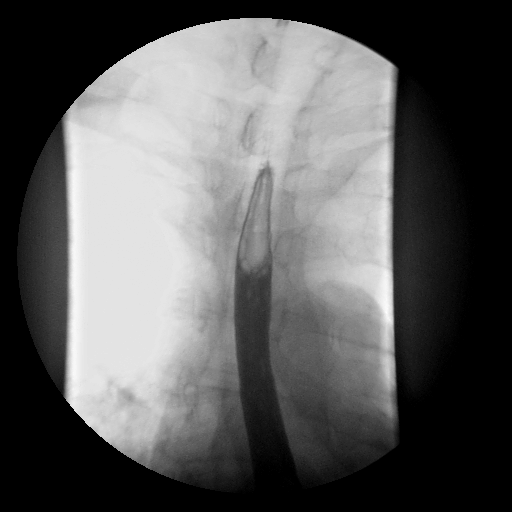

[Series 2: run · 1 of 1 slices shown (2 of 19)]
[im 1/1]
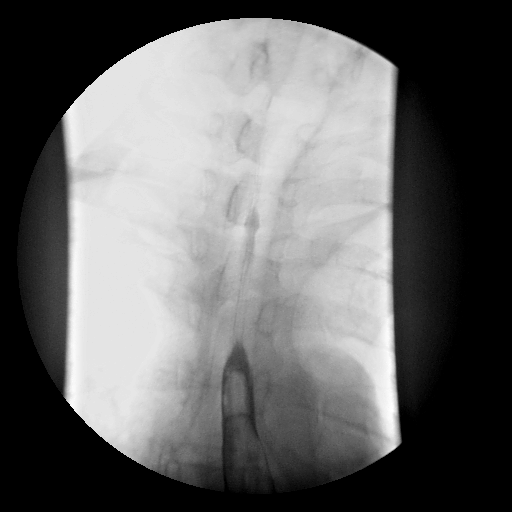

[Series 4: run · 1 of 1 slices shown (3 of 19)]
[im 1/1]
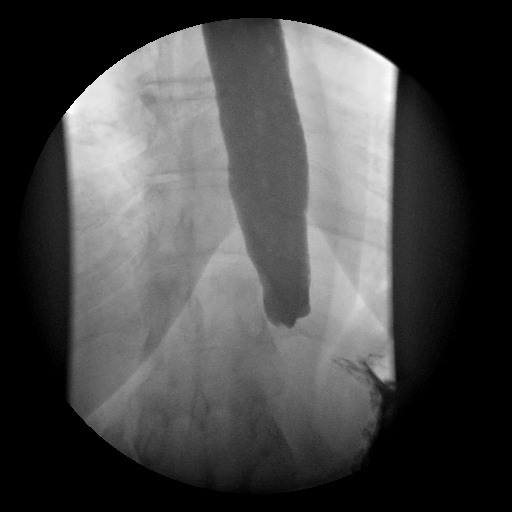

[Series 5: run · 1 of 1 slices shown (4 of 19)]
[im 1/1]
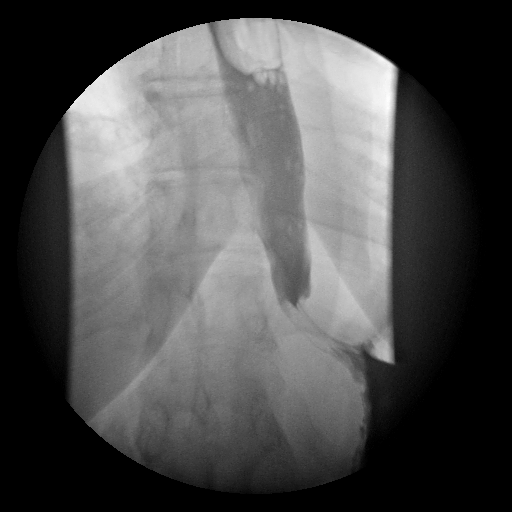

[Series 6: run · 1 of 1 slices shown (5 of 19)]
[im 1/1]
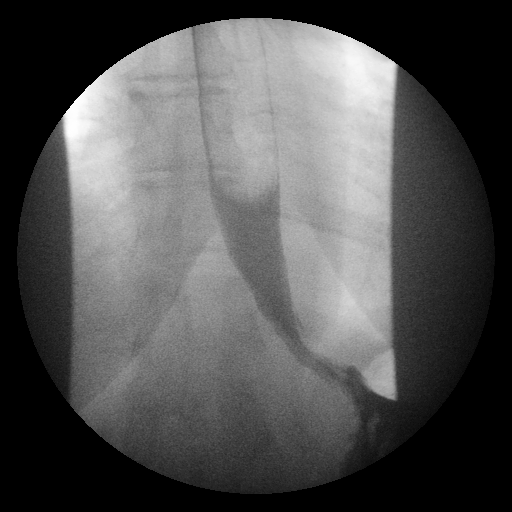

[Series 7: run · 1 of 1 slices shown (6 of 19)]
[im 1/1]
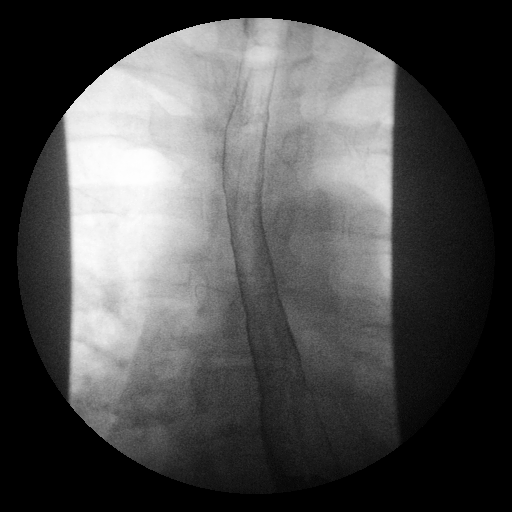

[Series 9: run · 1 of 1 slices shown (7 of 19)]
[im 1/1]
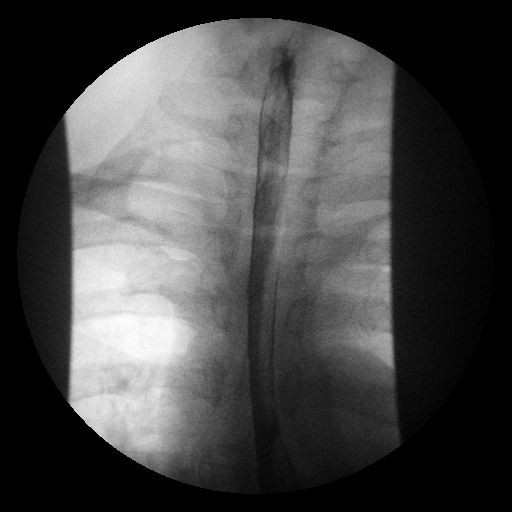

[Series 10: run · 1 of 1 slices shown (8 of 19)]
[im 1/1]
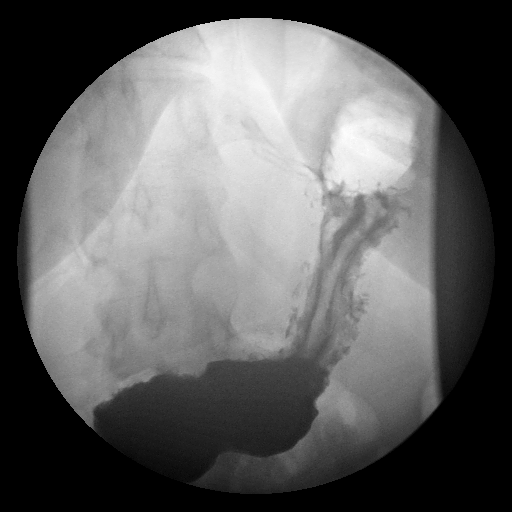

[Series 11: run · 1 of 1 slices shown (9 of 19)]
[im 1/1]
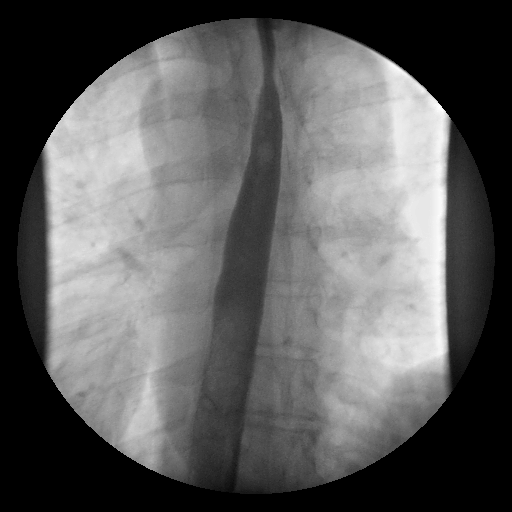

[Series 13: run · 1 of 1 slices shown (10 of 19)]
[im 1/1]
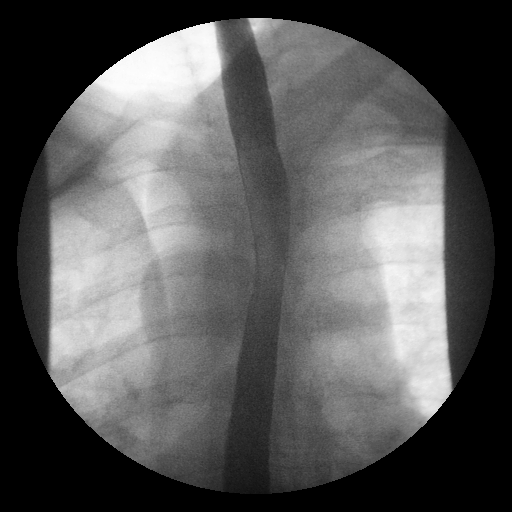

[Series 14: run · 1 of 1 slices shown (11 of 19)]
[im 1/1]
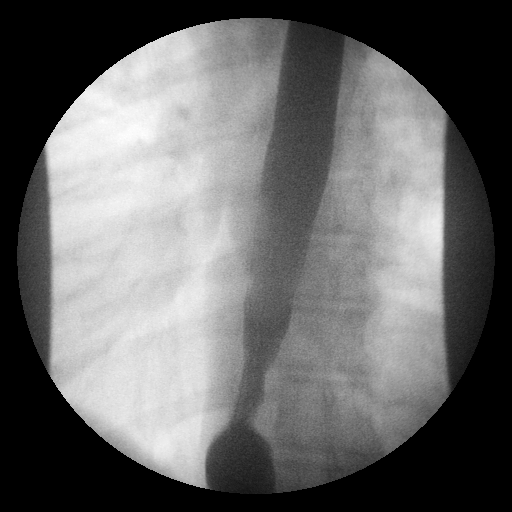

[Series 15: run · 1 of 1 slices shown (12 of 19)]
[im 1/1]
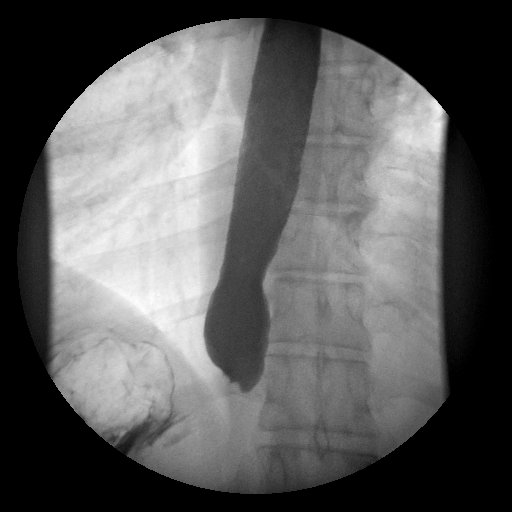

[Series 16: run · 1 of 1 slices shown (13 of 19)]
[im 1/1]
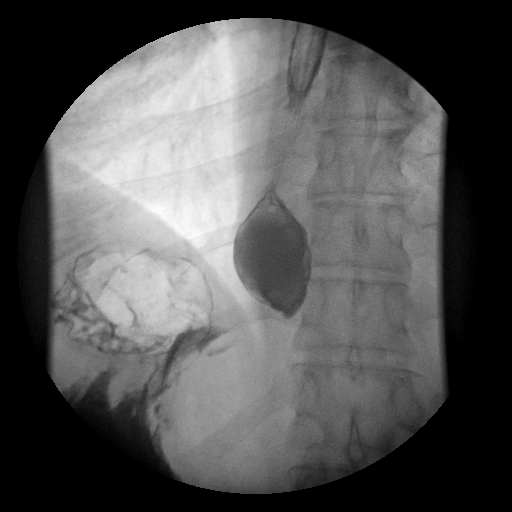

[Series 18: run · 1 of 1 slices shown (14 of 19)]
[im 1/1]
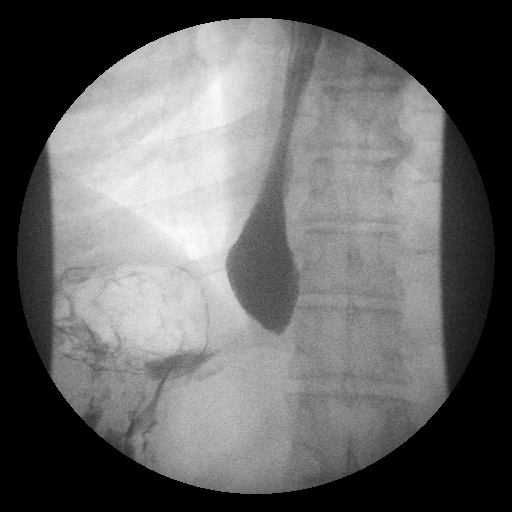

[Series 19: run · 1 of 1 slices shown (15 of 19)]
[im 1/1]
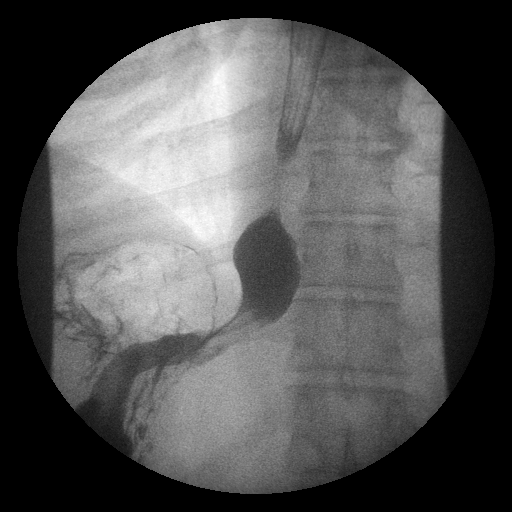

[Series 20: run · 1 of 1 slices shown (16 of 19)]
[im 1/1]
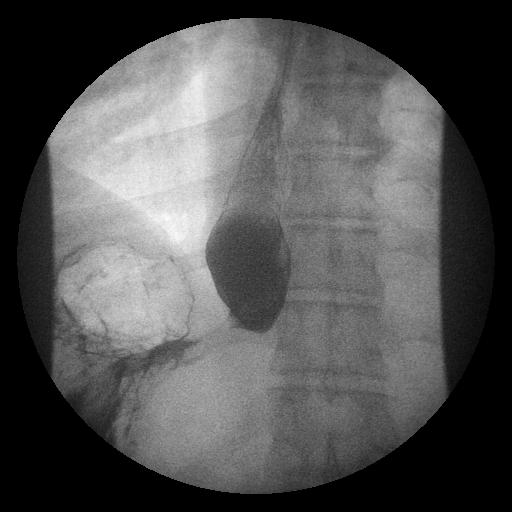

[Series 21: run · 1 of 1 slices shown (17 of 19)]
[im 1/1]
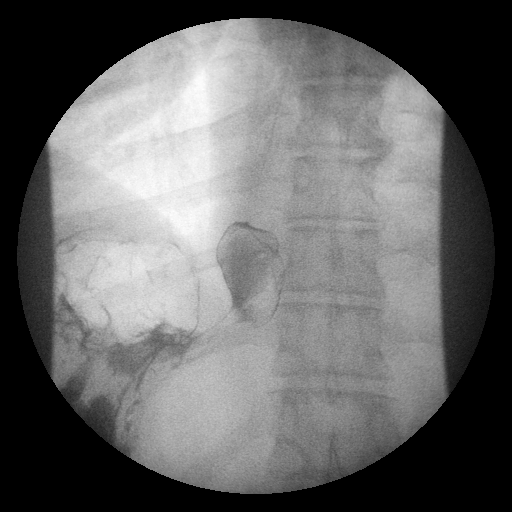

[Series 23: run · 1 of 1 slices shown (18 of 19)]
[im 1/1]
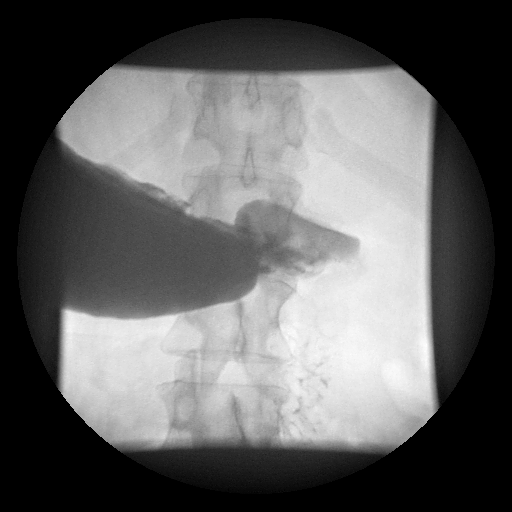

[Series 24: run · 1 of 1 slices shown (19 of 19)]
[im 1/1]
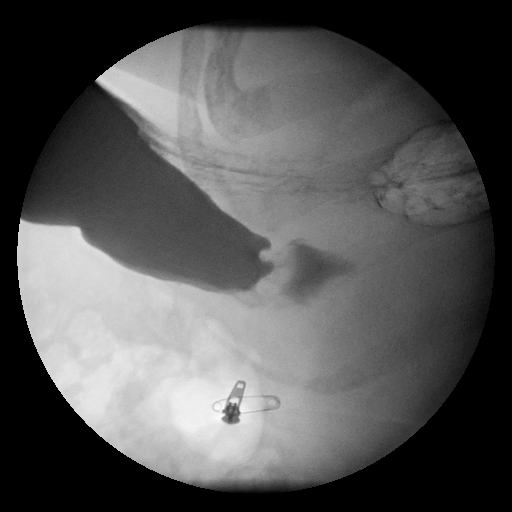

[19 of 24 positions shown; findings below may reference images not displayed]

FINDINGS: The esophagus is normal in contour, distensibility, and
peristalsis.  No stricture, obstruction or mass is identified on
single contrast imaging.

The stomach has normal appearances.  Negative for hiatal hernia.
The duodenum and duodenal C-loop have normal appearances. No
spontaneous or elicited gastroesophageal reflux was identified.
IMPRESSION: Single contrast upper GI series is within normal limits.

## 2014-01-03 ENCOUNTER — Other Ambulatory Visit: Payer: Self-pay

## 2014-01-03 DIAGNOSIS — Z1231 Encounter for screening mammogram for malignant neoplasm of breast: Secondary | ICD-10-CM

## 2014-01-14 ENCOUNTER — Ambulatory Visit
Admission: RE | Admit: 2014-01-14 | Discharge: 2014-01-14 | Disposition: A | Discharge status: Home / Self Care | Payer: Managed Care, Other (non HMO) | Source: Ambulatory Visit

## 2014-01-14 DIAGNOSIS — Z1231 Encounter for screening mammogram for malignant neoplasm of breast: Secondary | ICD-10-CM

## 2014-03-07 ENCOUNTER — Ambulatory Visit (INDEPENDENT_AMBULATORY_CARE_PROVIDER_SITE_OTHER): Payer: Managed Care, Other (non HMO) | Admitting: Neurology

## 2014-03-07 ENCOUNTER — Encounter: Payer: Self-pay | Admitting: Neurology

## 2014-03-07 ENCOUNTER — Telehealth: Payer: Self-pay

## 2014-03-07 VITALS — BP 119/75 | HR 88 | Ht 62.0 in | Wt 276.0 lb

## 2014-03-07 DIAGNOSIS — E7800 Pure hypercholesterolemia, unspecified: Secondary | ICD-10-CM

## 2014-03-07 DIAGNOSIS — I10 Essential (primary) hypertension: Secondary | ICD-10-CM | POA: Insufficient documentation

## 2014-03-07 DIAGNOSIS — E785 Hyperlipidemia, unspecified: Secondary | ICD-10-CM | POA: Insufficient documentation

## 2014-03-07 DIAGNOSIS — E119 Type 2 diabetes mellitus without complications: Secondary | ICD-10-CM | POA: Insufficient documentation

## 2014-03-07 DIAGNOSIS — H532 Diplopia: Secondary | ICD-10-CM

## 2014-03-07 MED ORDER — CLOPIDOGREL BISULFATE 75 MG PO TABS: 75.0000 mg | ORAL_TABLET | Freq: Every day | ORAL | Status: DC

## 2014-03-07 NOTE — Telephone Encounter (Signed)
Per Dr.Yan request gave patient xanax packet explained instructions.

## 2014-03-07 NOTE — Progress Notes (Signed)
PATIENT: Laura Lam DOB: 09/10/1955  HISTORICAL  Laura Lam is a 58 year old left-handed female, is referred by her primary care physician Dr. Shaune Pollackonna Gates for evaluation of double vision  She had a past medical history of obesity, borderline diabetes, hypertension, hyperlipidemia, woke up November ninth 2015, noticed acute onset double vision, it was binocular, difficulty focusing, mild dizziness, nausea, symptoms last for one week, gradually improved  She denies hearing change, was evaluated by ophthalmologist, and ENT, no etiology found,  She has been taking aspirin 81 mg for a while  She is now almost back to herself, but still has double vision when looking to the left side, it is horizontal   REVIEW OF SYSTEMS: Full 14 system review of systems performed and notable only for depression, insomnia, restless leg, blurry vision, double vision  ALLERGIES: Allergies  Allergen Reactions  . Azithromycin Other (See Comments)    Stomach pain  . Demerol Other (See Comments)    Low bp    HOME MEDICATIONS: Current Outpatient Prescriptions on File Prior to Visit  Medication Sig Dispense Refill  . aspirin EC 81 MG tablet Take 81 mg by mouth daily.      Marland Kitchen. losartan (COZAAR) 50 MG tablet Take 50 mg by mouth daily.      Marland Kitchen. albuterol (PROVENTIL) (5 MG/ML) 0.5% nebulizer solution Take 0.5 mLs (2.5 mg total) by nebulization every 4 (four) hours as needed for wheezing. 20 mL 1  . metFORMIN (GLUCOPHAGE) 500 MG tablet Take 1 tablet (500 mg total) by mouth 2 (two) times daily with a meal. 30 tablet 0   No current facility-administered medications on file prior to visit.    PAST MEDICAL HISTORY: Past Medical History  Diagnosis Date  . Asthma   . Hypertension   . Diabetes   . High cholesterol     PAST SURGICAL HISTORY: Past Surgical History  Procedure Laterality Date  . Finger surgery Left   . Tonsillectomy and adenoidectomy      FAMILY HISTORY: Family History  Problem  Relation Age of Onset  . Heart failure Father   . Colon cancer Father   . Dementia Father     SOCIAL HISTORY:  History   Social History  . Marital Status: Married    Spouse Name: Tinnie GensJeffrey    Number of Children: 2  . 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 Smoking status: Former Smoker -- 0.50 packs/day    Quit date: 03/20/2003  . Smokeless tobacco: Never Used  . Alcohol Use: No  . Drug Use: No  . Sexual Activity: No   Other Topics Concern  . Not on file   Social History Narrative   Patient lives at home with her husband Tinnie Gens(Jeffrey).   Homemaker.    Education high school.   Left handed.   Caffeine one cup of coffee daily and one glass of tea.     PHYSICAL EXAM   Filed Vitals:   03/07/14 0906  BP: 119/75  Pulse: 88  Height: 5\' 2"  (1.575 m)  Weight: 276 lb (125.193 kg)    Not recorded      Body mass index is 50.47 kg/(m^2).   Generalized: In no acute distress  Neck: Supple, no carotid bruits   Cardiac: Regular rate rhythm  Pulmonary: Clear to auscultation bilaterally  Musculoskeletal: No deformity  Neurological examination  Mentation: Alert oriented to time, place, history taking, and causual conversation  Cranial nerve II-XII: Pupils were equal round reactive to light. Extraocular movements were full. She has bilateral exophoria, complains of horizontal double vision when looking to the left side, consistent with right medial rectus weakness.  Visual field were full on confrontational test. Bilateral fundi were sharp.  Facial sensation and strength were normal. Hearing was intact to finger rubbing bilaterally. Uvula tongue midline.  Head turning and shoulder shrug and were normal and symmetric.Tongue protrusion into cheek strength was normal.  Motor: Normal tone, bulk and strength.  Sensory: Intact to fine touch, pinprick, preserved vibratory sensation, and proprioception at toes.  Coordination:  Normal finger to nose, heel-to-shin bilaterally there was no truncal ataxia  Gait: Rising up from seated position without assistance, normal stance, without trunk ataxia, moderate stride, good arm swing, smooth turning, able to perform tiptoe, and heel walking without difficulty.   Romberg signs: Negative  Deep tendon reflexes: Brachioradialis 3/3, biceps 3/3, triceps 3/3, patellar 3/3, Achilles 2/2, plantar responses were extensor bilaterally.   DIAGNOSTIC DATA (LABS, IMAGING, TESTING) - I reviewed patient records, labs, notes, testing and imaging myself where available.  Lab Results  Component Value Date   WBC 8.5 03/27/2011   HGB 12.8 03/27/2011   HCT 38.3 03/27/2011   MCV 87.6 03/27/2011   PLT 238 03/27/2011      Component Value Date/Time   NA 131* 03/27/2011 0110   K 3.9 03/27/2011 0110   CL 97 03/27/2011 0110   CO2 23 03/27/2011 0110   GLUCOSE 230* 03/27/2011 0110   BUN 9 03/27/2011 0110   CREATININE 0.54 03/27/2011 0110   CALCIUM 9.1 03/27/2011 0110   GFRNONAA >90 03/27/2011 0110   GFRAA >90 03/27/2011 0110   No results found for: CHOL, HDL, LDLCALC, LDLDIRECT, TRIG, CHOLHDL  ASSESSMENT AND PLAN  Laura Lam is a 58 y.o. female with past medical history of hypertension borderline diabetes, hyperlipidemia, obesity presenting with acute onset of binocular double vision, nausea in February 14 2014, which has improved. On examination, she has bilateral exophoria, and gaze nystagmus, more to the right side, red lens testing showed right medial rectus weakness, Hyperreflexia, bilateral Babinski signs   Differentiation diagnosis: Most suggestive of brainstem/cerebellar stroke, she also has upper motor neuron signs, could indicating multiple small vessel disease, versus cervical spondylitic myelopathy.  1. Complete evaluation with MRI of brain, MRA of brain and neck 2. Stop aspirin, start Plavix 75 mg daily 3. Return to clinic in 2 weeks 4. Continue moderate exercise, well  hydration.       Levert FeinsteinYijun Peirce Deveney, M.D. Ph.D.  [facility] [address], [address] [place], [place] [address] [phone]

## 2014-03-21 ENCOUNTER — Ambulatory Visit: Payer: Managed Care, Other (non HMO) | Admitting: Neurology

## 2014-03-23 ENCOUNTER — Ambulatory Visit: Payer: Managed Care, Other (non HMO)

## 2014-03-23 DIAGNOSIS — H532 Diplopia: Secondary | ICD-10-CM

## 2014-03-29 ENCOUNTER — Telehealth: Payer: Self-pay | Admitting: *Deleted

## 2014-03-29 NOTE — Telephone Encounter (Signed)
I have called her,   MRI showed small vessel disease. MRA of brain and neck showed no large vessel disease.  She should continue plavix, keep follow up in Jan 22

## 2014-04-29 ENCOUNTER — Ambulatory Visit: Payer: Self-pay | Admitting: Neurology

## 2014-05-02 ENCOUNTER — Encounter: Payer: Self-pay | Admitting: Neurology

## 2014-05-02 ENCOUNTER — Ambulatory Visit: Payer: Self-pay | Admitting: Neurology

## 2014-05-02 ENCOUNTER — Telehealth: Payer: Self-pay | Admitting: Neurology

## 2014-05-02 ENCOUNTER — Ambulatory Visit (INDEPENDENT_AMBULATORY_CARE_PROVIDER_SITE_OTHER): Payer: Managed Care, Other (non HMO) | Admitting: Neurology

## 2014-05-02 VITALS — BP 128/91 | HR 99 | Ht 62.0 in | Wt 280.0 lb

## 2014-05-02 DIAGNOSIS — G4730 Sleep apnea, unspecified: Secondary | ICD-10-CM

## 2014-05-02 DIAGNOSIS — R0683 Snoring: Secondary | ICD-10-CM

## 2014-05-02 DIAGNOSIS — G4719 Other hypersomnia: Secondary | ICD-10-CM

## 2014-05-02 DIAGNOSIS — H532 Diplopia: Secondary | ICD-10-CM

## 2014-05-02 NOTE — Telephone Encounter (Signed)
Dr. Levert FeinsteinYijun Lam, refers patient for attended sleep study.  Height: 5'2"  Weight: 280 lb  BMI: 51.20  Past Medical History:  Asthma    . Hypertension   . Diabetes   . High cholesterol      Sleep Symptoms: she has symptoms consistent with obstructive sleep apnea, loud snoring, excessive daytime fatigue, sleepiness   Epworth Score: ESS (15)   Medication:   Clopidogrel Bisulfate (Tab) PLAVIX 75 MG Take 1 tablet (75 mg total) by mouth daily.       Losartan Potassium (Tab) COZAAR 50 MG Take 50 mg by mouth daily.       Rosuvastatin Calcium (Tab) CRESTOR 20 MG Take 20 mg by mouth daily.      Venlafaxine HCl (Tab) EFFEXOR 25 MG Take 25 mg by mouth daily.         Ins: [employer]   Assessment & Plan:  Laura Lam is a 59 y.o. female with past medical history of hypertension borderline diabetes, hyperlipidemia, obesity presenting with acute onset of binocular double vision, nausea in February 14 2014, which has improved.   Her symptoms still highly suggestive of a possible brainstem/cerebellar stroke  Continue Plavix 75 mg daily  She has symptoms consistent with obstructive sleep apnea, refer her to sleep study  Orders Placed This Encounter  Procedures  . Ambulatory referral to Sleep Studies     Return in about 3 months (around 08/01/2014).   Please review patient information and submit instructions for scheduling and orders for sleep technologist. Thank you.

## 2014-05-02 NOTE — Progress Notes (Signed)
PATIENT: Laura Lam DOB: 04-06-1956  HISTORICAL ( initial visit Mar 07 2014)  [name] is a 59 year old left-handed female, is referred by her primary care physician Dr. Shaune Pollack for evaluation of double vision  She had a past medical history of obesity, borderline diabetes, hypertension, hyperlipidemia, woke up November ninth 2015, noticed acute onset double vision, it was binocular, difficulty focusing, mild dizziness, nausea, symptoms last for one week, gradually improved  She denies hearing change, was evaluated by ophthalmologist, and ENT, no etiology found,  She has been taking aspirin 81 mg for a while  She is now almost back to herself, but still has double vision when looking to the left side, it is horizontal  UPDATE Jan 25th 2016: Her vision has much improved, no trouble swallowing, no drooping eye lid, no limb weakness.  We have reviewed MRI brain, small vessel disease, no acute lesions. There was white matter disease at ventral pons MRA brain/neck, no large vessel disease.  She is taking Plavix  qday, she has symptoms consistent with obstructive sleep apnea, loud snoring, excessive daytime fatigue, sleepiness, ESS score is 15  REVIEW OF SYSTEMS: Full 14 system review of systems performed and notable only for depression, insomnia, restless leg, blurry vision, double vision  ALLERGIES: Allergies  Allergen Reactions  . Azithromycin Other (See Comments)    Stomach pain  . Demerol Other (See Comments)    Low bp    HOME MEDICATIONS: Current Outpatient Prescriptions on File Prior to Visit  Medication Sig Dispense Refill  . clopidogrel (PLAVIX) 75 MG tablet Take 1 tablet (75 mg total) by mouth daily. 30 tablet 11  . losartan (COZAAR) 50 MG tablet Take 50 mg by mouth daily.      . rosuvastatin (CRESTOR) 20 MG tablet Take 20 mg by mouth daily.    Marland Kitchen venlafaxine (EFFEXOR) 25 MG tablet Take 25 mg by mouth daily.    Marland Kitchen albuterol (PROVENTIL) (5 MG/ML) 0.5%  nebulizer solution Take 0.5 mLs (2.5 mg total) by nebulization every 4 (four) hours as needed for wheezing. 20 mL 1   No current facility-administered medications on file prior to visit.    PAST MEDICAL HISTORY: Past Medical History  Diagnosis Date  . Asthma   . Hypertension   . Diabetes   . High cholesterol     PAST SURGICAL HISTORY: Past Surgical History  Procedure Laterality Date  . Finger surgery Left   . Tonsillectomy and adenoidectomy      FAMILY HISTORY: Family History  Problem Relation Age of Onset  . Heart failure Father   . Colon cancer Father   . Dementia Father     SOCIAL HISTORY:  History   Social History  . Marital Status: Married    Spouse Name: Tinnie Gens    Number of Children: 2  . 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 Smoking status: Former Smoker -- 0.50 packs/day    Quit date: 03/20/2003  . Smokeless tobacco: Never Used  . Alcohol Use: No  . Drug Use: No  . Sexual Activity: No   Other Topics Concern  . Not on file   Social History Narrative   Patient lives at home with her husband Tinnie Gens).   Homemaker.    Education high school.   Left handed.   Caffeine one cup of coffee daily and one glass of tea.     PHYSICAL EXAM   Filed  Vitals:   05/02/14 1132  BP: 128/91  Pulse: 99  Height: 5\' 2"  (1.575 m)  Weight: 280 lb (127.007 kg)    Not recorded      Body mass index is 51.2 kg/(m^2).   Generalized: In no acute distress  Neck: Supple, no carotid bruits   Cardiac: Regular rate rhythm  Pulmonary: Clear to auscultation bilaterally  Musculoskeletal: No deformity  Neurological examination  Mentation: Alert oriented to time, place, history taking, and causual conversation  Cranial nerve II-XII: Pupils were equal round reactive to light. Extraocular movements were full. She has bilateral exophoria, complains of horizontal double vision when looking to the left side,  consistent with right medial rectus weakness.  Visual field were full on confrontational test. Bilateral fundi were sharp.  Facial sensation and strength were normal. Hearing was intact to finger rubbing bilaterally. Uvula tongue midline.  Head turning and shoulder shrug and were normal and symmetric.Tongue protrusion into cheek strength was normal.  Motor: Normal tone, bulk and strength.  Sensory: Intact to fine touch, pinprick, preserved vibratory sensation, and proprioception at toes.  Coordination: Normal finger to nose, heel-to-shin bilaterally there was no truncal ataxia  Gait: Rising up from seated position without assistance, normal stance, without trunk ataxia, moderate stride, good arm swing, smooth turning, able to perform tiptoe, and heel walking without difficulty.   Romberg signs: Negative  Deep tendon reflexes: Brachioradialis 3/3, biceps 3/3, triceps 3/3, patellar 3/3, Achilles 2/2, plantar responses were extensor bilaterally.   DIAGNOSTIC DATA (LABS, IMAGING, TESTING) - I reviewed patient records, labs, notes, testing and imaging myself where available.  Lab Results  Component Value Date   WBC 8.5 03/27/2011   HGB 12.8 03/27/2011   HCT 38.3 03/27/2011   MCV 87.6 03/27/2011   PLT 238 03/27/2011      Component Value Date/Time   NA 131* 03/27/2011 0110   K 3.9 03/27/2011 0110   CL 97 03/27/2011 0110   CO2 23 03/27/2011 0110   GLUCOSE 230* 03/27/2011 0110   BUN 9 03/27/2011 0110   CREATININE 0.54 03/27/2011 0110   CALCIUM 9.1 03/27/2011 0110   GFRNONAA >90 03/27/2011 0110   GFRAA >90 03/27/2011 0110   No results found for: CHOL, HDL, LDLCALC, LDLDIRECT, TRIG, CHOLHDL  ASSESSMENT AND PLAN  Laura Lam is a 59 y.o. female with past medical history of hypertension borderline diabetes, hyperlipidemia, obesity presenting with acute onset of binocular double vision, nausea in February 14 2014, which has improved.   Her symptoms still highly suggestive of a  possible brainstem/cerebellar stroke  Continue Plavix 75 mg daily  She has symptoms consistent with obstructive sleep apnea, refer her to sleep study  Orders Placed This Encounter  Procedures  . Ambulatory referral to Sleep Studies     Return in about 3 months (around 08/01/2014).    Levert FeinsteinYijun Ankush Gintz, M.D. Ph.D.  [facility] [address], [address] [place], [place] [address] [phone]

## 2014-05-03 NOTE — Telephone Encounter (Signed)
In House Sleep study request review: This patient has an underlying medical history of hypertension borderline diabetes, hyperlipidemia, and morbid obesity and episode of acute onset of binocular double vision and nausea in November 2015, and is referred by Dr. Terrace ArabiaYan for an attended sleep study due to a report of snoring, excessive daytime somnolence, and morbid obesity. I will order a split-night sleep study and see the patient in sleep medicine consultation afterwards as appropriate. Please print this note and attach to sleep study chart/package.   Sleep Acquisition Technologist instructions: Please score at 3% and split if 2 hour estimated AHI >15/h, unless mandated otherwise by the insurance carrier: [employer].    Huston FoleySaima Arnita Koons, MD, PhD [facility])

## 2014-06-01 ENCOUNTER — Telehealth: Payer: Self-pay | Admitting: Neurology

## 2014-06-01 ENCOUNTER — Encounter: Payer: Self-pay | Admitting: Neurology

## 2014-06-01 NOTE — Telephone Encounter (Signed)
FYI: I have tried numerous times to contact the patient to schedule her sleep study with no return call to the office. I will mail the patient a letter asking for a call back to discuss the sleep study.

## 2014-08-02 ENCOUNTER — Telehealth: Payer: Self-pay | Admitting: *Deleted

## 2014-08-02 ENCOUNTER — Ambulatory Visit: Payer: Managed Care, Other (non HMO) | Admitting: Neurology

## 2014-08-02 NOTE — Telephone Encounter (Signed)
No showed appt today. 

## 2014-08-03 ENCOUNTER — Encounter: Payer: Self-pay | Admitting: Neurology

## 2015-03-10 ENCOUNTER — Other Ambulatory Visit: Payer: Self-pay | Admitting: Neurology

## 2015-03-12 ENCOUNTER — Other Ambulatory Visit: Payer: Self-pay

## 2015-03-19 ENCOUNTER — Other Ambulatory Visit: Payer: Self-pay | Admitting: Neurology

## 2015-03-20 NOTE — Telephone Encounter (Signed)
Patient needs to contact office to schedule a follow up appointment.

## 2015-10-07 ENCOUNTER — Encounter (HOSPITAL_COMMUNITY): Payer: Self-pay | Admitting: Emergency Medicine

## 2015-10-07 ENCOUNTER — Other Ambulatory Visit: Payer: Self-pay

## 2015-10-07 ENCOUNTER — Emergency Department (HOSPITAL_COMMUNITY): Payer: Managed Care, Other (non HMO)

## 2015-10-07 DIAGNOSIS — Z87891 Personal history of nicotine dependence: Secondary | ICD-10-CM | POA: Insufficient documentation

## 2015-10-07 DIAGNOSIS — J45909 Unspecified asthma, uncomplicated: Secondary | ICD-10-CM | POA: Insufficient documentation

## 2015-10-07 DIAGNOSIS — Z79899 Other long term (current) drug therapy: Secondary | ICD-10-CM | POA: Insufficient documentation

## 2015-10-07 DIAGNOSIS — E119 Type 2 diabetes mellitus without complications: Secondary | ICD-10-CM | POA: Insufficient documentation

## 2015-10-07 DIAGNOSIS — R202 Paresthesia of skin: Secondary | ICD-10-CM | POA: Diagnosis present

## 2015-10-07 DIAGNOSIS — I10 Essential (primary) hypertension: Secondary | ICD-10-CM | POA: Diagnosis not present

## 2015-10-07 DIAGNOSIS — M5412 Radiculopathy, cervical region: Secondary | ICD-10-CM | POA: Diagnosis not present

## 2015-10-07 LAB — I-STAT TROPONIN, ED: Troponin i, poc: 0 ng/mL (ref 0.00–0.08)

## 2015-10-07 LAB — BASIC METABOLIC PANEL
ANION GAP: 10 (ref 5–15)
BUN: 12 mg/dL (ref 6–20)
CHLORIDE: 98 mmol/L — AB (ref 101–111)
CO2: 28 mmol/L (ref 22–32)
CREATININE: 0.8 mg/dL (ref 0.44–1.00)
Calcium: 9.6 mg/dL (ref 8.9–10.3)
GFR calc non Af Amer: >60 mL/min (ref >60)
Glucose, Bld: 193 mg/dL — ABNORMAL HIGH (ref 65–99)
POTASSIUM: 3.4 mmol/L — AB (ref 3.5–5.1)
SODIUM: 136 mmol/L (ref 135–145)

## 2015-10-07 LAB — CBC
HEMATOCRIT: 43.6 % (ref 36.0–46.0)
HEMOGLOBIN: 14.6 g/dL (ref 12.0–15.0)
MCH: 30 pg (ref 26.0–34.0)
MCHC: 33.5 g/dL (ref 30.0–36.0)
MCV: 89.7 fL (ref 78.0–100.0)
Platelets: 287 10*3/uL (ref 150–400)
RBC: 4.86 MIL/uL (ref 3.87–5.11)
RDW: 12.9 % (ref 11.5–15.5)
WBC: 9.4 10*3/uL (ref 4.0–10.5)

## 2015-10-07 NOTE — ED Notes (Signed)
Pt. reports left arm tingling and mild left chest tightness with nausea and diaphoresis onset 9 am this morning .

## 2015-10-08 ENCOUNTER — Emergency Department (HOSPITAL_COMMUNITY)
Admission: EM | Admit: 2015-10-08 | Discharge: 2015-10-08 | Disposition: A | Discharge status: Home / Self Care | Payer: Managed Care, Other (non HMO) | Attending: Emergency Medicine | Admitting: Emergency Medicine

## 2015-10-08 DIAGNOSIS — M5412 Radiculopathy, cervical region: Secondary | ICD-10-CM

## 2015-10-08 HISTORY — DX: Obesity, unspecified: E66.9

## 2015-10-08 LAB — TROPONIN I

## 2015-10-08 MED ORDER — PREDNISONE 10 MG PO TABS: 20.0000 mg | ORAL_TABLET | Freq: Every day | ORAL | Status: DC

## 2015-10-08 MED ORDER — NAPROXEN 500 MG PO TABS: 500.0000 mg | ORAL_TABLET | Freq: Two times a day (BID) | ORAL | Status: DC

## 2015-10-08 MED ORDER — KETOROLAC TROMETHAMINE 30 MG/ML IJ SOLN
30.0000 mg | Freq: Once | INTRAMUSCULAR | Status: AC
  Administered 2015-10-08: 30 mg via INTRAVENOUS
  Filled 2015-10-08: qty 1

## 2015-10-08 MED ORDER — MORPHINE SULFATE (PF) 4 MG/ML IV SOLN
4.0000 mg | Freq: Once | INTRAVENOUS | Status: AC
  Administered 2015-10-08: 4 mg via INTRAVENOUS
  Filled 2015-10-08: qty 1

## 2015-10-08 MED ORDER — ONDANSETRON HCL 4 MG/2ML IJ SOLN
4.0000 mg | Freq: Once | INTRAMUSCULAR | Status: AC
  Administered 2015-10-08: 4 mg via INTRAVENOUS
  Filled 2015-10-08: qty 2

## 2015-10-08 MED ORDER — METHYLPREDNISOLONE SODIUM SUCC 125 MG IJ SOLR
125.0000 mg | Freq: Once | INTRAMUSCULAR | Status: AC
  Administered 2015-10-08: 125 mg via INTRAVENOUS
  Filled 2015-10-08: qty 2

## 2015-10-08 MED ORDER — CYCLOBENZAPRINE HCL 10 MG PO TABS: 5.0000 mg | ORAL_TABLET | Freq: Two times a day (BID) | ORAL | Status: DC | PRN

## 2015-10-08 MED ORDER — DIAZEPAM 5 MG/ML IJ SOLN
2.5000 mg | Freq: Once | INTRAMUSCULAR | Status: AC
  Administered 2015-10-08: 2.5 mg via INTRAVENOUS
  Filled 2015-10-08: qty 2

## 2015-10-08 MED ORDER — POTASSIUM CHLORIDE CRYS ER 20 MEQ PO TBCR
40.0000 meq | EXTENDED_RELEASE_TABLET | Freq: Once | ORAL | Status: AC
  Administered 2015-10-08: 40 meq via ORAL
  Filled 2015-10-08: qty 2

## 2015-10-08 NOTE — ED Provider Notes (Signed)
CSN: [id number]     Arrival date & time 10/07/15  2031 History   First MD Initiated Contact with Patient 10/08/15 0014     Chief Complaint  Patient presents with  . Tingling     (Consider location/radiation/quality/duration/timing/severity/associated sxs/prior Treatment) HPI  PCP: Hollice EspyGATES,DONNA RUTH, MD Sharl Maracey Allcock female59 y.o. PMH: [facility] which [facility], asthma, hypertension, diabetes, high cholesterol, obesity, former smoker  Pt reports walking up around 9 am and around approx 9:30 am she developed a deep aching pain and paraesthesias to left shoulder/arm. She denies having any numbness or weakness. She describes the pain as starting at the shoulder. It has been constant, not waxing or waning for the past 16 hours. Nothing makes it better or worse. She denies ever having symptoms similar in the past.  ROS: The patient denies confusion,  abdominal pains, V/D, gas, dysuria, abnormal  bleeding, genital discharge, fever, headache, weakness (general or focal), confusion, change of vision,  dysphagia, aphagia, shortness of breath, lower extremity swelling, rash, neck pain, shortness of breath,  back pain.   Past Medical History  Diagnosis Date  . Asthma   . Hypertension   . Diabetes (HCC)   . High cholesterol   . Obesity    Past Surgical History  Procedure Laterality Date  . Finger surgery Left   . Tonsillectomy and adenoidectomy     Family History  Problem Relation Age of Onset  . Heart failure Father   . Colon cancer Father   . Dementia Father    Social History  Substance Use Topics  . Smoking status: Former Smoker -- 0.00 packs/day    Quit date: 03/20/2003  . Smokeless tobacco: Never Used  . Alcohol Use: No   OB History    No data available     Review of Systems  Review of Systems All other systems negative except as documented in the HPI. All pertinent positives and negatives as reviewed in the HPI.   Allergies  Azithromycin and  Demerol  Home Medications   Prior to Admission medications   Medication Sig Start Date End Date Taking? Authorizing Provider  losartan (COZAAR) 50 MG tablet Take 50 mg by mouth daily.     Yes Historical Provider, MD  rosuvastatin (CRESTOR) 20 MG tablet Take 20 mg by mouth daily.   Yes Historical Provider, MD  venlafaxine (EFFEXOR) 25 MG tablet Take 25 mg by mouth daily.   Yes Historical Provider, MD  clopidogrel (PLAVIX) 75 MG tablet Take 1 tablet (75 mg total) by mouth daily. Please call office for an appointment. Patient not taking: Reported on 10/08/2015 03/20/15   Levert FeinsteinYijun Yan, MD  cyclobenzaprine (FLEXERIL) 10 MG tablet Take 0.5-1 tablets (5-10 mg total) by mouth 2 (two) times daily as needed. 10/08/15   Devann Cribb Neva SeatGreene, PA-C  naproxen (NAPROSYN) 500 MG tablet Take 1 tablet (500 mg total) by mouth 2 (two) times daily. 10/08/15   Timiya Howells Neva SeatGreene, PA-C  predniSONE (DELTASONE) 10 MG tablet Take 2 tablets (20 mg total) by mouth daily. 10/08/15   Roldan Laforest Neva SeatGreene, PA-C   BP 109/73 mmHg  Pulse 94  Temp(Src) 98.9 F (37.2 C) (Oral)  Resp 20  Ht 5\' 2"  (1.575 m)  Wt 130.267 kg  BMI 52.51 kg/m2  SpO2 91% Physical Exam  Constitutional: She appears well-developed and well-nourished. No distress.  HENT:  Head: Normocephalic and atraumatic.  Right Ear: Tympanic membrane and ear canal normal.  Left Ear: Tympanic membrane and ear canal normal.  Nose: Nose normal.  Mouth/Throat: Uvula is midline, oropharynx is clear and moist and mucous membranes are normal.  Eyes: Pupils are equal, round, and reactive to light.  Neck: Normal range of motion. Neck supple.  Cardiovascular: Normal rate and regular rhythm.   Pulmonary/Chest: Effort normal.  Abdominal: Soft.  No signs of abdominal distention  Musculoskeletal:       Left shoulder: She exhibits normal range of motion, no tenderness, no bony tenderness, no swelling, no effusion, no crepitus, no deformity, no laceration, no pain, no spasm, normal pulse and  normal strength.  No LE swelling  No UE swelling, NIV, normal sensation to touch.  Neurological: She is alert.  Acting at baseline  Skin: Skin is warm and dry. No rash noted.  Nursing note and vitals reviewed.   ED Course  Procedures (including critical care time) Labs Review Labs Reviewed  BASIC METABOLIC PANEL - Abnormal; Notable for the following:    Potassium 3.4 (*)    Chloride 98 (*)    Glucose, Bld 193 (*)    All other components within normal limits  CBC  TROPONIN I  Rosezena SensorI-STAT TROPOININ, ED    Imaging Review Dg Chest 2 View  10/07/2015  CLINICAL DATA:  60 year old female with chest pressure EXAM: CHEST  2 VIEW COMPARISON:  Chest CT dated 03/26/2011 FINDINGS: There is mild eventration of the right hemidiaphragm. There minimal bibasilar atelectatic changes of the lungs. There is no focal consolidation, pleural effusion, or pneumothorax. The cardiac silhouette is within normal limits with no acute osseous pathology identified. IMPRESSION: No active cardiopulmonary disease. Electronically Signed   By: Elgie CollardArash  Radparvar M.D.   On: 10/07/2015 21:31   I have personally reviewed and evaluated these images and lab results as part of my medical decision-making.   EKG Interpretation   Date/Time:  Saturday October 07 2015 20:43:53 EDT Ventricular Rate:  111 PR Interval:  160 QRS Duration: 88 QT Interval:  346 QTC Calculation: 470 R Axis:   -16 Text Interpretation:  Sinus tachycardia Low voltage QRS Cannot rule out  Anterior infarct , age undetermined Abnormal ECG Confirmed by Fayrene FearingJAMES  MD,  MARK ([id number]) on 10/08/2015 2:42:40 AM      MDM   Final diagnoses:  Cervical radiculopathy    Patient is to be discharged with recommendation to follow up with PCP in regards to today's hospital visit. Pain is not likely of cardiac or pulmonary etiology d/t presentation consistent with cervical radiculopathy.   EKG without acute abnormalities, negative troponin x 2, and negative CXR.   Pt has  been advised start a steroid dose pack and muscle relaxer and return to the ED is  worsens or becomes concerning in any way. Pt appears reliable for follow up and is agreeable to discharge. Patient advised to keep a close eye on glucose as steroids can cause an unsafe elevation in glucose.  Case has been discussed with and seen by Dr. Fayrene FearingJames who agrees with the above plan to discharge.    Marlon Peliffany Janiesha Diehl, PA-C 10/08/15 [phone]  Rolland PorterMark James, MD 10/08/15 [phone]

## 2015-10-08 NOTE — Discharge Instructions (Signed)

## 2017-04-10 ENCOUNTER — Encounter (HOSPITAL_COMMUNITY): Payer: Self-pay | Admitting: Emergency Medicine

## 2017-04-10 ENCOUNTER — Emergency Department (HOSPITAL_COMMUNITY): Payer: Self-pay

## 2017-04-10 ENCOUNTER — Other Ambulatory Visit: Payer: Self-pay

## 2017-04-10 DIAGNOSIS — E785 Hyperlipidemia, unspecified: Secondary | ICD-10-CM | POA: Insufficient documentation

## 2017-04-10 DIAGNOSIS — I10 Essential (primary) hypertension: Secondary | ICD-10-CM | POA: Insufficient documentation

## 2017-04-10 DIAGNOSIS — Z87891 Personal history of nicotine dependence: Secondary | ICD-10-CM | POA: Insufficient documentation

## 2017-04-10 DIAGNOSIS — Z79899 Other long term (current) drug therapy: Secondary | ICD-10-CM | POA: Insufficient documentation

## 2017-04-10 DIAGNOSIS — G459 Transient cerebral ischemic attack, unspecified: Principal | ICD-10-CM | POA: Insufficient documentation

## 2017-04-10 DIAGNOSIS — Z7902 Long term (current) use of antithrombotics/antiplatelets: Secondary | ICD-10-CM | POA: Insufficient documentation

## 2017-04-10 DIAGNOSIS — E119 Type 2 diabetes mellitus without complications: Secondary | ICD-10-CM | POA: Insufficient documentation

## 2017-04-10 DIAGNOSIS — J45909 Unspecified asthma, uncomplicated: Secondary | ICD-10-CM | POA: Insufficient documentation

## 2017-04-10 LAB — I-STAT CHEM 8, ED
BUN: 10 mg/dL (ref 6–20)
CREATININE: 0.6 mg/dL (ref 0.44–1.00)
Calcium, Ion: 1.09 mmol/L — ABNORMAL LOW (ref 1.15–1.40)
Chloride: 100 mmol/L — ABNORMAL LOW (ref 101–111)
GLUCOSE: 124 mg/dL — AB (ref 65–99)
HCT: 49 % — ABNORMAL HIGH (ref 36.0–46.0)
HEMOGLOBIN: 16.7 g/dL — AB (ref 12.0–15.0)
POTASSIUM: 3.7 mmol/L (ref 3.5–5.1)
Sodium: 139 mmol/L (ref 135–145)
TCO2: 27 mmol/L (ref 22–32)

## 2017-04-10 LAB — COMPREHENSIVE METABOLIC PANEL
ALK PHOS: 103 U/L (ref 38–126)
ALT: 35 U/L (ref 14–54)
AST: 34 U/L (ref 15–41)
Albumin: 4.2 g/dL (ref 3.5–5.0)
Anion gap: 13 (ref 5–15)
BILIRUBIN TOTAL: 1 mg/dL (ref 0.3–1.2)
BUN: 9 mg/dL (ref 6–20)
CALCIUM: 9.3 mg/dL (ref 8.9–10.3)
CHLORIDE: 98 mmol/L — AB (ref 101–111)
CO2: 25 mmol/L (ref 22–32)
CREATININE: 0.62 mg/dL (ref 0.44–1.00)
GFR calc Af Amer: >60 mL/min (ref >60)
Glucose, Bld: 117 mg/dL — ABNORMAL HIGH (ref 65–99)
Potassium: 3.6 mmol/L (ref 3.5–5.1)
Sodium: 136 mmol/L (ref 135–145)
Total Protein: 7.5 g/dL (ref 6.5–8.1)

## 2017-04-10 LAB — CBC
HEMATOCRIT: 47.8 % — AB (ref 36.0–46.0)
HEMOGLOBIN: 15.7 g/dL — AB (ref 12.0–15.0)
MCH: 29.3 pg (ref 26.0–34.0)
MCHC: 32.8 g/dL (ref 30.0–36.0)
MCV: 89.3 fL (ref 78.0–100.0)
Platelets: 257 10*3/uL (ref 150–400)
RBC: 5.35 MIL/uL — ABNORMAL HIGH (ref 3.87–5.11)
RDW: 13.1 % (ref 11.5–15.5)
WBC: 8.3 10*3/uL (ref 4.0–10.5)

## 2017-04-10 LAB — DIFFERENTIAL
BASOS ABS: 0 10*3/uL (ref 0.0–0.1)
BASOS PCT: 1 %
Eosinophils Absolute: 0.2 10*3/uL (ref 0.0–0.7)
Eosinophils Relative: 2 %
LYMPHS PCT: 27 %
Lymphs Abs: 2.3 10*3/uL (ref 0.7–4.0)
MONO ABS: 0.6 10*3/uL (ref 0.1–1.0)
MONOS PCT: 7 %
Neutro Abs: 5.3 10*3/uL (ref 1.7–7.7)
Neutrophils Relative %: 63 %

## 2017-04-10 LAB — PROTIME-INR
INR: 1.02
PROTHROMBIN TIME: 13.3 s (ref 11.4–15.2)

## 2017-04-10 LAB — I-STAT TROPONIN, ED: TROPONIN I, POC: 0.01 ng/mL (ref 0.00–0.08)

## 2017-04-10 LAB — I-STAT BETA HCG BLOOD, ED (MC, WL, AP ONLY): I-stat hCG, quantitative: <5 m[IU]/mL (ref <5)

## 2017-04-10 LAB — APTT: APTT: 29 s (ref 24–36)

## 2017-04-10 NOTE — ED Notes (Signed)
Dr. Criss AlvineGoldston aware of pt's symptoms, no code stroke called at this time.

## 2017-04-10 NOTE — Care Management (Addendum)
ED CM consulted by [place] BingKim RN in triage concerning patient having difficulty affording B/P medications. CM reviewed patient's record patient covered  [employer] health insurance as per record. Patient in waiting room CM will f/u with  patient once she is roomed.

## 2017-04-10 NOTE — ED Triage Notes (Signed)
Pt st's approx 1 hour ago she had right arm weakness and slurred speech lasting approx 10-15 mins.  All symptoms have subsided at this time.  Pt has hx of HTN but can't afford her meds.  Pt alert and oriented x's 3 at this time with no neuro symptoms

## 2017-04-11 ENCOUNTER — Observation Stay (HOSPITAL_COMMUNITY): Payer: Self-pay

## 2017-04-11 ENCOUNTER — Observation Stay (HOSPITAL_BASED_OUTPATIENT_CLINIC_OR_DEPARTMENT_OTHER): Payer: Self-pay

## 2017-04-11 ENCOUNTER — Observation Stay (HOSPITAL_BASED_OUTPATIENT_CLINIC_OR_DEPARTMENT_OTHER)
Admit: 2017-04-11 | Discharge: 2017-04-11 | Disposition: A | Discharge status: Home / Self Care | Payer: Self-pay | Attending: Internal Medicine | Admitting: Internal Medicine

## 2017-04-11 ENCOUNTER — Encounter (HOSPITAL_COMMUNITY): Payer: Self-pay | Admitting: Internal Medicine

## 2017-04-11 ENCOUNTER — Observation Stay (HOSPITAL_COMMUNITY)
Admission: EM | Admit: 2017-04-11 | Discharge: 2017-04-12 | Disposition: A | Discharge status: Home / Self Care | Payer: Self-pay | Attending: Internal Medicine | Admitting: Internal Medicine

## 2017-04-11 DIAGNOSIS — G459 Transient cerebral ischemic attack, unspecified: Secondary | ICD-10-CM

## 2017-04-11 DIAGNOSIS — R531 Weakness: Secondary | ICD-10-CM

## 2017-04-11 DIAGNOSIS — E6609 Other obesity due to excess calories: Secondary | ICD-10-CM

## 2017-04-11 DIAGNOSIS — E1159 Type 2 diabetes mellitus with other circulatory complications: Secondary | ICD-10-CM

## 2017-04-11 DIAGNOSIS — E669 Obesity, unspecified: Secondary | ICD-10-CM | POA: Diagnosis present

## 2017-04-11 DIAGNOSIS — E785 Hyperlipidemia, unspecified: Secondary | ICD-10-CM

## 2017-04-11 DIAGNOSIS — R4781 Slurred speech: Secondary | ICD-10-CM | POA: Diagnosis present

## 2017-04-11 DIAGNOSIS — E119 Type 2 diabetes mellitus without complications: Secondary | ICD-10-CM

## 2017-04-11 DIAGNOSIS — Z6833 Body mass index (BMI) 33.0-33.9, adult: Secondary | ICD-10-CM

## 2017-04-11 DIAGNOSIS — I10 Essential (primary) hypertension: Secondary | ICD-10-CM

## 2017-04-11 HISTORY — DX: Transient cerebral ischemic attack, unspecified: G45.9

## 2017-04-11 LAB — GLUCOSE, CAPILLARY
GLUCOSE-CAPILLARY: 186 mg/dL — AB (ref 65–99)
Glucose-Capillary: 141 mg/dL — ABNORMAL HIGH (ref 65–99)

## 2017-04-11 LAB — ECHOCARDIOGRAM COMPLETE
Ao-asc: 37 cm
CHL CUP MV DEC (S): 144
E decel time: 144 msec
FS: 30 % (ref 28–44)
IV/PV OW: 1.18
LA diam end sys: 35 mm
LA diam index: 1.39 cm/m2
LA vol A4C: 40.5 ml
LA vol index: 20.4 mL/m2
LA vol: 51.6 mL
LASIZE: 35 mm
LDCA: 3.14 cm2
LV PW d: 11 mm — AB (ref 0.6–1.1)
LVOT diameter: 20 mm
MVPKEVEL: 0.7 m/s
TAPSE: 22.4 mm

## 2017-04-11 LAB — CBG MONITORING, ED: GLUCOSE-CAPILLARY: 160 mg/dL — AB (ref 65–99)

## 2017-04-11 LAB — HEMOGLOBIN A1C
HEMOGLOBIN A1C: 7.2 % — AB (ref 4.8–5.6)
MEAN PLASMA GLUCOSE: 159.94 mg/dL

## 2017-04-11 LAB — LIPID PANEL
CHOLESTEROL: 288 mg/dL — AB (ref 0–200)
HDL: 68 mg/dL (ref >40)
LDL Cholesterol: 189 mg/dL — ABNORMAL HIGH (ref 0–99)
Total CHOL/HDL Ratio: 4.2 RATIO
Triglycerides: 154 mg/dL — ABNORMAL HIGH (ref <150)
VLDL: 31 mg/dL (ref 0–40)

## 2017-04-11 LAB — HIV ANTIBODY (ROUTINE TESTING W REFLEX): HIV SCREEN 4TH GENERATION: NONREACTIVE

## 2017-04-11 MED ORDER — LORAZEPAM 2 MG/ML IJ SOLN
0.5000 mg | Freq: Once | INTRAMUSCULAR | Status: AC
  Administered 2017-04-11: 0.5 mg via INTRAVENOUS
  Filled 2017-04-11: qty 1

## 2017-04-11 MED ORDER — ASPIRIN 300 MG RE SUPP: 300.0000 mg | Freq: Every day | RECTAL | Status: DC

## 2017-04-11 MED ORDER — ACETAMINOPHEN 160 MG/5ML PO SOLN: 650.0000 mg | ORAL | Status: DC | PRN

## 2017-04-11 MED ORDER — SENNOSIDES-DOCUSATE SODIUM 8.6-50 MG PO TABS: 1.0000 | ORAL_TABLET | Freq: Every evening | ORAL | Status: DC | PRN

## 2017-04-11 MED ORDER — VENLAFAXINE HCL 25 MG PO TABS
25.0000 mg | ORAL_TABLET | Freq: Every day | ORAL | Status: DC
  Administered 2017-04-11: 25 mg via ORAL
  Filled 2017-04-11 (×3): qty 1

## 2017-04-11 MED ORDER — ACETAMINOPHEN 325 MG PO TABS: 650.0000 mg | ORAL_TABLET | ORAL | Status: DC | PRN

## 2017-04-11 MED ORDER — ACETAMINOPHEN 650 MG RE SUPP: 650.0000 mg | RECTAL | Status: DC | PRN

## 2017-04-11 MED ORDER — ASPIRIN 81 MG PO CHEW
324.0000 mg | CHEWABLE_TABLET | Freq: Once | ORAL | Status: AC
Start: 2017-04-11 — End: 2017-04-11
  Administered 2017-04-11: 324 mg via ORAL
  Filled 2017-04-11: qty 4

## 2017-04-11 MED ORDER — ATORVASTATIN CALCIUM 40 MG PO TABS: 40.0000 mg | ORAL_TABLET | Freq: Every day | ORAL | Status: DC

## 2017-04-11 MED ORDER — ROSUVASTATIN CALCIUM 20 MG PO TABS
20.0000 mg | ORAL_TABLET | Freq: Every day | ORAL | Status: DC
  Filled 2017-04-11: qty 1

## 2017-04-11 MED ORDER — ROSUVASTATIN CALCIUM 20 MG PO TABS
40.0000 mg | ORAL_TABLET | Freq: Every day | ORAL | Status: DC
  Administered 2017-04-11: 40 mg via ORAL
  Filled 2017-04-11: qty 1

## 2017-04-11 MED ORDER — STROKE: EARLY STAGES OF RECOVERY BOOK
Freq: Once | Status: AC
  Administered 2017-04-11: 18:00:00
  Filled 2017-04-11: qty 1

## 2017-04-11 MED ORDER — ENOXAPARIN SODIUM 40 MG/0.4ML ~~LOC~~ SOLN
40.0000 mg | Freq: Every day | SUBCUTANEOUS | Status: DC
  Administered 2017-04-11 – 2017-04-12 (×2): 40 mg via SUBCUTANEOUS
  Filled 2017-04-11 (×3): qty 0.4

## 2017-04-11 MED ORDER — SODIUM CHLORIDE 0.9 % IV SOLN: INTRAVENOUS | Status: AC

## 2017-04-11 MED ORDER — ALBUTEROL SULFATE (2.5 MG/3ML) 0.083% IN NEBU: 2.5000 mg | INHALATION_SOLUTION | Freq: Four times a day (QID) | RESPIRATORY_TRACT | Status: DC | PRN

## 2017-04-11 MED ORDER — ASPIRIN 325 MG PO TABS
325.0000 mg | ORAL_TABLET | Freq: Every day | ORAL | Status: DC
  Administered 2017-04-12: 325 mg via ORAL
  Filled 2017-04-11: qty 1

## 2017-04-11 MED ORDER — LOSARTAN POTASSIUM 50 MG PO TABS
50.0000 mg | ORAL_TABLET | Freq: Every day | ORAL | Status: DC
  Administered 2017-04-11 – 2017-04-12 (×2): 50 mg via ORAL
  Filled 2017-04-11 (×3): qty 1

## 2017-04-11 NOTE — ED Notes (Signed)
Admitting Provider at bedside. 

## 2017-04-11 NOTE — Consult Note (Signed)
Referring Physician: Dr. Betsey Holiday     Chief Complaint: Transient dysarthria with right arm weakness and sensory numbness  HPI: Laura Lam is an 62 y.o. female with no prior history of stroke or MI who presents to the ED after a 10-15 minute episode of dysarthria, numbness, tingling and weakness of her right face and arm. She states that she had no leg symptoms. Also with no incoordination, vision loss, confusion, aphasia or trouble walking. Her husband did not notice a facial droop but agreed with her that her speech was slurred. She states that she is back to her baseline.   PMHx includes DM, hypercholesterolemia and HTN.   Past Medical History:  Diagnosis Date  . Asthma   . Diabetes ([place])   . High cholesterol   . Hypertension   . Obesity     Past Surgical History:  Procedure Laterality Date  . FINGER SURGERY Left   . TONSILLECTOMY AND ADENOIDECTOMY      Family History  Problem Relation Age of Onset  . Heart failure Father   . Colon cancer Father   . Dementia Father    Social History:  reports that she quit smoking about 14 years ago. She smoked 0.00 packs per day. she has never used smokeless tobacco. She reports that she does not drink alcohol or use drugs.  Allergies:  Allergies  Allergen Reactions  . Azithromycin Other (See Comments)    Stomach pain  . Demerol Other (See Comments)    Low bp    Home Medications:  Albuterol Flexeril Cozaar Crestor Effexor  ROS: As per HPI.   Physical Examination: Blood pressure (!) 141/83, pulse 97, temperature 98.1 F (36.7 C), resp. rate 19, height '5\' 2"'$  (1.575 m), weight 135.2 kg (298 lb), SpO2 93 %.  General: Morbidly obese HEENT: [place]/AT Lungs: Respirations unlabored Ext: No edema  Neurologic Examination: Mental Status: Alert, oriented, thought content appropriate.  Speech fluent without evidence of aphasia.  Able to follow all commands without difficulty. Cranial Nerves: II:  Visual fields grossly normal, pupils  equal, round, reactive to light  III,IV, VI: ptosis not present, extra-ocular movements intact bilaterally V,VII: Smile symmetric. Decreased temp sensation to left side of face VIII: hearing intact to voice IX,X: No hypophonia or hoarseness XI: Symmertic XII: midline tongue extension  Motor: Right : Upper extremity   5/5    Left:     Upper extremity   5/5  Lower extremity   5/5     Lower extremity   5/5 Normal tone throughout; no atrophy noted Sensory: Decreased temp sensation RUE. FT intact x 4 without extinction.   Deep Tendon Reflexes:  2+ bilateral upper and lower extremities without asymmetry Plantars: Right: downgoing  Left: downgoing Cerebellar: No ataxia with FNF bilaterally Gait: Deferred  Results for orders placed or performed during the hospital encounter of 04/11/17 (from the past 48 hour(s))  I-stat troponin, ED     Status: None   Collection Time: 04/10/17  8:46 PM  Result Value Ref Range   Troponin i, poc 0.01 0.00 - 0.08 ng/mL   Comment 3            Comment: Due to the release kinetics of cTnI, a negative result within the first hours of the onset of symptoms does not rule out myocardial infarction with certainty. If myocardial infarction is still suspected, repeat the test at appropriate intervals.   I-Stat beta hCG blood, ED     Status: None   Collection Time: 04/10/17  8:47 PM  Result Value Ref Range   I-stat hCG, quantitative <5.0 <5 mIU/mL   Comment 3            Comment:   GEST. AGE      CONC.  (mIU/mL)   <=1 WEEK        5 - 50     2 WEEKS       50 - 500     3 WEEKS       100 - 10,000     4 WEEKS     1,000 - 30,000        FEMALE AND NON-PREGNANT FEMALE:     LESS THAN 5 mIU/mL   I-Stat Chem 8, ED     Status: Abnormal   Collection Time: 04/10/17  8:48 PM  Result Value Ref Range   Sodium 139 135 - 145 mmol/L   Potassium 3.7 3.5 - 5.1 mmol/L   Chloride 100 (L) 101 - 111 mmol/L   BUN 10 6 - 20 mg/dL   Creatinine, Ser 0.60 0.44 - 1.00 mg/dL   Glucose,  Bld 124 (H) 65 - 99 mg/dL   Calcium, Ion 1.09 (L) 1.15 - 1.40 mmol/L   TCO2 27 22 - 32 mmol/L   Hemoglobin 16.7 (H) 12.0 - 15.0 g/dL   HCT 49.0 (H) 36.0 - 46.0 %  Protime-INR     Status: None   Collection Time: 04/10/17  9:24 PM  Result Value Ref Range   Prothrombin Time 13.3 11.4 - 15.2 seconds   INR 1.02   APTT     Status: None   Collection Time: 04/10/17  9:24 PM  Result Value Ref Range   aPTT 29 24 - 36 seconds  CBC     Status: Abnormal   Collection Time: 04/10/17  9:24 PM  Result Value Ref Range   WBC 8.3 4.0 - 10.5 K/uL   RBC 5.35 (H) 3.87 - 5.11 MIL/uL   Hemoglobin 15.7 (H) 12.0 - 15.0 g/dL   HCT 47.8 (H) 36.0 - 46.0 %   MCV 89.3 78.0 - 100.0 fL   MCH 29.3 26.0 - 34.0 pg   MCHC 32.8 30.0 - 36.0 g/dL   RDW 13.1 11.5 - 15.5 %   Platelets 257 150 - 400 K/uL  Differential     Status: None   Collection Time: 04/10/17  9:24 PM  Result Value Ref Range   Neutrophils Relative % 63 %   Neutro Abs 5.3 1.7 - 7.7 K/uL   Lymphocytes Relative 27 %   Lymphs Abs 2.3 0.7 - 4.0 K/uL   Monocytes Relative 7 %   Monocytes Absolute 0.6 0.1 - 1.0 K/uL   Eosinophils Relative 2 %   Eosinophils Absolute 0.2 0.0 - 0.7 K/uL   Basophils Relative 1 %   Basophils Absolute 0.0 0.0 - 0.1 K/uL  Comprehensive metabolic panel     Status: Abnormal   Collection Time: 04/10/17  9:24 PM  Result Value Ref Range   Sodium 136 135 - 145 mmol/L   Potassium 3.6 3.5 - 5.1 mmol/L   Chloride 98 (L) 101 - 111 mmol/L   CO2 25 22 - 32 mmol/L   Glucose, Bld 117 (H) 65 - 99 mg/dL   BUN 9 6 - 20 mg/dL   Creatinine, Ser 0.62 0.44 - 1.00 mg/dL   Calcium 9.3 8.9 - 10.3 mg/dL   Total Protein 7.5 6.5 - 8.1 g/dL   Albumin 4.2 3.5 - 5.0 g/dL  AST 34 15 - 41 U/L   ALT 35 14 - 54 U/L   Alkaline Phosphatase 103 38 - 126 U/L   Total Bilirubin 1.0 0.3 - 1.2 mg/dL   GFR calc non Af Amer >60 >60 mL/min   GFR calc Af Amer >60 >60 mL/min    Comment: (NOTE) The eGFR has been calculated using the CKD EPI equation. This  calculation has not been validated in all clinical situations. eGFR's persistently <60 mL/min signify possible Chronic Kidney Disease.    Anion gap 13 5 - 15   Ct Head Wo Contrast  Result Date: 04/10/2017 CLINICAL DATA:  TIA.  Right-sided weakness and slurred speech EXAM: CT HEAD WITHOUT CONTRAST TECHNIQUE: Contiguous axial images were obtained from the base of the skull through the vertex without intravenous contrast. COMPARISON:  None available FINDINGS: Brain: No evidence of acute infarction, hemorrhage, hydrocephalus, extra-axial collection or mass lesion/mass effect. 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 Chronic small vessel ischemic type change in the cerebral white matter. Vascular: No hyperdense vessel. Skull: No acute finding Sinuses/Orbits: Negative IMPRESSION: 1. No acute finding. 2. Chronic small vessel ischemia. Electronically Signed   By: Monte Fantasia M.D.   On: 04/10/2017 21:42    Assessment: 62 y.o. female presenting with transient right facial droop and RUE weakness 1. Exam is nonfocal except for left face and RUE sensory numbness to temperature. Of note, there are no other findings on exam to suggest a Wallenberg's syndrome 2. CT head without acute abnormality. Chronic small vessel ischemic changes noted.  3. Stroke Risk Factors - DM, hypercholesterolemia and HTN.   Plan: 1. HgbA1c, fasting lipid panel 2. MRI, MRA of the brain without contrast 3. PT consult, OT consult, Speech consult 4. Echocardiogram 5. Carotid dopplers 6. ASA 81 mg po qd 7. Risk factor modification 8. Telemetry monitoring 9. Frequent neuro checks 10. Consider starting Lipitor, pending results of stroke/TIA work up 72. Permissive HTN x 24 hours  _0  signed: Dr. Kerney Elbe 04/11/2017, 7:26 AM

## 2017-04-11 NOTE — ED Notes (Signed)
Pt ate meal tray from cafeteria. 

## 2017-04-11 NOTE — Evaluation (Signed)
Physical Therapy Evaluation & Discharge  Patient Details Name: Laura Lam MRN: [id number] DOB: 1956-02-03 Today's Date: 04/11/2017   History of Present Illness  Pt is a 62 y/o female admitted secondary to slurred speech and weakness. CT and MRI of brain were both negative. PMH including but not limited to DM and HTN.  Clinical Impression  Pt presented supine in bed with HOB elevated, awake and willing to participate in therapy session. Prior to admission, pt reported that she was independent with all functional mobility and ADLs. Pt lives in a two level house with her spouse who is available intermittently to assist if needed. Pt ambulated in hallway without use of an AD with supervision for safety. Pt participated in higher level balance tasks without difficulty. Pt reported that she feels she is at her functional baseline currently. No further acute PT needs identified at this time. PT signing off.     Follow Up Recommendations No PT follow up    Equipment Recommendations  None recommended by PT    Recommendations for Other Services       Precautions / Restrictions Precautions Precautions: None Restrictions Weight Bearing Restrictions: No      Mobility  Bed Mobility Overal bed mobility: Modified Independent                Transfers Overall transfer level: Modified independent Equipment used: None                Ambulation/Gait Ambulation/Gait assistance: Supervision Ambulation Distance (Feet): 300 Feet Assistive device: None Gait Pattern/deviations: Step-through pattern;Drifts right/left Gait velocity: decreased Gait velocity interpretation: Below normal speed for age/gender General Gait Details: no instability or LOB, pt able to participate in higher level balance assessment with no difficulties  Stairs            Wheelchair Mobility    Modified Rankin (Stroke Patients Only)       Balance Overall balance assessment: Needs  assistance Sitting-balance support: No upper extremity supported Sitting balance-Leahy Scale: Normal     Standing balance support: During functional activity;No upper extremity supported Standing balance-Leahy Scale: Good               High level balance activites: Turns;Direction changes;Sudden stops;Head turns High Level Balance Comments: supervision for safety, no LOB or need for physical assistance             Pertinent Vitals/Pain Pain Assessment: No/denies pain    Home Living Family/patient expects to be discharged to:: Private residence Living Arrangements: Spouse/significant other Available Help at Discharge: Family;Available PRN/intermittently Type of Home: House Home Access: Level entry     Home Layout: Two level Home Equipment: None      Prior Function Level of Independence: Independent               Hand Dominance        Extremity/Trunk Assessment   Upper Extremity Assessment Upper Extremity Assessment: Overall WFL for tasks assessed    Lower Extremity Assessment Lower Extremity Assessment: Overall WFL for tasks assessed       Communication   Communication: No difficulties  Cognition Arousal/Alertness: Awake/alert Behavior During Therapy: WFL for tasks assessed/performed Overall Cognitive Status: Within Functional Limits for tasks assessed                                        General Comments      Exercises  Assessment/Plan    PT Assessment Patent does not need any further PT services  PT Problem List         PT Treatment Interventions      PT Goals (Current goals can be found in the Care Plan section)  Acute Rehab PT Goals Patient Stated Goal: return home today    Frequency     Barriers to discharge        Co-evaluation               AM-PAC PT "6 Clicks" Daily Activity  Outcome Measure Difficulty turning over in bed (including adjusting bedclothes, sheets and blankets)?: A  Little Difficulty moving from lying on back to sitting on the side of the bed? : A Little Difficulty sitting down on and standing up from a chair with arms (e.g., wheelchair, bedside commode, etc,.)?: None Help needed moving to and from a bed to chair (including a wheelchair)?: None Help needed walking in hospital room?: None Help needed climbing 3-5 steps with a railing? : A Little 6 Click Score: 21    End of Session Equipment Utilized During Treatment: Gait belt Activity Tolerance: Patient tolerated treatment well Patient left: in bed;with call bell/phone within reach Nurse Communication: Mobility status PT Visit Diagnosis: Other symptoms and signs involving the nervous system (R29.898)    Time: 1056-1106 PT Time Calculation (min) (ACUTE ONLY): 10 min   Charges:   PT Evaluation $PT Eval Low Complexity: 1 Low     PT G Codes:   PT G-Codes **NOT FOR INPATIENT CLASS** Functional Assessment Tool Used: AM-PAC 6 Clicks Basic Mobility;Clinical judgement Functional Limitation: Mobility: Walking and moving around Mobility: Walking and Moving Around Current Status ([id number]): At least 20 percent but less than 40 percent impaired, limited or restricted Mobility: Walking and Moving Around Goal Status [phone]): 0 percent impaired, limited or restricted Mobility: Walking and Moving Around Discharge Status [phone](G8980): At least 20 percent but less than 40 percent impaired, limited or restricted    [facility] CenterJennifer Lashan [place], [place]PT, DPT [phone]   Alessandra BevelsJennifer M Ottilia Pippenger 04/11/2017, 11:20 AM

## 2017-04-11 NOTE — Progress Notes (Signed)
Pt admitted to the unit from ED via wheelchair with family at side; pt A&O x4; pt oriented to the unit and room; telemetry applied and verified with CCMD: NT called to second verify. VSS; pt in bed with call light within reach and family at beside. Will continue to monitor closely. Dionne BucyP. Amo Markiya Keefe RN

## 2017-04-11 NOTE — ED Notes (Signed)
Patient transported to X-ray 

## 2017-04-11 NOTE — ED Notes (Signed)
Pt returned from MRI at this time

## 2017-04-11 NOTE — H&P (Signed)
History and Physical    Laura Lam WUJ:[id number] DOB: September 27, 1955 DOA: 04/11/2017  PCP: Shaune Pollack, MD Patient coming from: home  Chief Complaint: slurred speech  HPI: Laura Lam is a 61 y.o. female with medical history significant for attention, obesity, asthma, diabetes since to the emergency Department chief complaint sudden onset slurred speech and right arm numbness tingling and weakness. Initial evaluation concerning for TIA. [facility] are asked to admit  Information is obtained from the patient and her husband who is at the bedside . Patient states she's been in her usual state of health she developed sudden numbness/tingling in her right arm. She states she tried to get up off the couch and was unable to do so. Husband reports she had slurred speech. Patient reports some numbness and tingling the right side of her face. Patient reports symptoms lasted approximately 10 minutes. She denies any headache dizziness visual disturbances syncope or near-syncope. She denies any chest pain palpitation shortness of breath lower extremity edema. She denies any abdominal pain nausea vomiting diarrhea constipation melena bright red blood per rectum. She denies any dysuria hematuria frequency or urgency. She denies any fever chills recent travel or sick contacts. She denies ever having had these symptoms before. She reports she has not taken blood pressure medication for over 3 weeks due to cost.  ED Course: In the emergency department she's afebrile hemodynamically stable with a blood pressure 162/105. Mild tachycardia oxygen saturation level 92% on room air. The time of admission she is symptom-free.  Review of Systems: As per HPI otherwise all other systems reviewed and are negative.   Ambulatory Status: Ambulates independently and is independent with ADLs  Past Medical History:  Diagnosis Date  . Asthma   . Diabetes (HCC)   . High cholesterol   . Hypertension   . Obesity   .  TIA (transient ischemic attack)     Past Surgical History:  Procedure Laterality Date  . FINGER SURGERY Left   . TONSILLECTOMY AND ADENOIDECTOMY      Social History   Socioeconomic History  . Marital status: Married    Spouse name: Tinnie Gens  . Number of children: 2  . Years of education: 32  . Highest education level: Not on file  Social Needs  . Financial resource strain: Not on file  . Food insecurity - worry: Not on file  . Food insecurity - inability: Not on file  . Transportation needs - medical: Not on file  . Transportation needs - non-medical: Not on file  Occupational History    Comment: Home maker  Tobacco Use  . Smoking status: Former Smoker    Packs/day: 0.00    Last attempt to quit: 03/20/2003    Years since quitting: 14.0  . Smokeless tobacco: Never Used  Substance and Sexual Activity  . Alcohol use: No    Alcohol/week: 0.0 oz  . Drug use: No  . Sexual activity: No  Other Topics Concern  . Not on file  Social History Narrative   Patient lives at home with her husband Tinnie Gens).   Homemaker.    Education high school.   Left handed.   Caffeine one cup of coffee daily and one glass of tea.    Allergies  Allergen Reactions  . Azithromycin Other (See Comments)    Stomach pain  . Demerol Other (See Comments)    Low bp    Family History  Problem Relation Age of Onset  . Heart failure Father   .  Colon cancer Father   . Dementia Father     Prior to Admission medications   Medication Sig Start Date End Date Taking? Authorizing Provider  albuterol (PROVENTIL HFA;VENTOLIN HFA) 108 (90 Base) MCG/ACT inhaler Inhale 1-2 puffs into the lungs every 6 (six) hours as needed for wheezing or shortness of breath.   Yes [provider]  cyclobenzaprine (FLEXERIL) 10 MG tablet Take 0.5-1 tablets (5-10 mg total) by mouth 2 (two) times daily as needed. Patient taking differently: Take 5-10 mg by mouth 2 (two) times daily as needed for muscle spasms.   10/08/15  Yes Neva Seat, Tiffany, PA-C  losartan (COZAAR) 50 MG tablet Take 50 mg by mouth daily.     Yes [provider]  rosuvastatin (CRESTOR) 20 MG tablet Take 20 mg by mouth daily.   Yes [provider]  venlafaxine (EFFEXOR) 25 MG tablet Take 25 mg by mouth daily.   Yes [provider]    Physical Exam: Vitals:   04/11/17 0440 04/11/17 0546 04/11/17 0600 04/11/17 0630  BP: (!) 161/98 (!) 145/96  (!) 141/83  Pulse: 92 97  97  Resp: 18 15  19   Temp: 98.2 F (36.8 C)  98.1 F (36.7 C)   TempSrc: Oral     SpO2: 99% 95%  93%  Weight:      Height:         General:  Appears calm and comfortable sitting up in bed in no acute distress Eyes:  PERRL, EOMI, normal lids, iris ENT:  grossly normal hearing, lips & tongue, because membranes of her mouth are moist and pink Neck:  no LAD, masses or thyromegaly Cardiovascular:  RRR, no m/r/g. No LE edema. Pedal pulses present and palpable Respiratory:   Normal respiratory effort. Breath sounds somewhat distant faint end-expiratory wheeze no crackles Abdomen:  soft, ntnd, obese positive bowel sounds throughout no guarding or rebounding Skin:  no rash or induration seen on limited exam Musculoskeletal:  grossly normal tone BUE/BLE, good ROM, no bony abnormality Psychiatric:  grossly normal mood and affect, speech fluent and appropriate, AOx3 Neurologic:  CN 2-12 grossly intact, moves all extremities in coordinated fashion, sensation intact speech clear facial symmetry. Tongue midline. Bilateral grip 5 out of 5. Bilateral lower extremity strength 5 out of 5. No pronator drift  Labs on Admission: I have personally reviewed following labs and imaging studies  CBC: Recent Labs  Lab 04/10/17 2048 04/10/17 2124  WBC  --  8.3  NEUTROABS  --  5.3  HGB 16.7* 15.7*  HCT 49.0* 47.8*  MCV  --  89.3  PLT  --  257   Basic Metabolic Panel: Recent Labs  Lab 04/10/17 2048 04/10/17 2124  NA 139 136  K 3.7 3.6  CL 100* 98*    CO2  --  25  GLUCOSE 124* 117*  BUN 10 9  CREATININE 0.60 0.62  CALCIUM  --  9.3   GFR: Estimated Creatinine Clearance: 100.5 mL/min (by C-G formula based on SCr of 0.62 mg/dL). Liver Function Tests: Recent Labs  Lab 04/10/17 2124  AST 34  ALT 35  ALKPHOS 103  BILITOT 1.0  PROT 7.5  ALBUMIN 4.2   No results for input(s): LIPASE, AMYLASE in the last 168 hours. No results for input(s): AMMONIA in the last 168 hours. Coagulation Profile: Recent Labs  Lab 04/10/17 2124  INR 1.02   Cardiac Enzymes: No results for input(s): CKTOTAL, CKMB, CKMBINDEX, TROPONINI in the last 168 hours. BNP (last 3 results)  No results for input(s): PROBNP in the last 8760 hours. HbA1C: No results for input(s): HGBA1C in the last 72 hours. CBG: No results for input(s): GLUCAP in the last 168 hours. Lipid Profile: No results for input(s): CHOL, HDL, LDLCALC, TRIG, CHOLHDL, LDLDIRECT in the last 72 hours. Thyroid Function Tests: No results for input(s): TSH, T4TOTAL, FREET4, T3FREE, THYROIDAB in the last 72 hours. Anemia Panel: No results for input(s): VITAMINB12, FOLATE, FERRITIN, TIBC, IRON, RETICCTPCT in the last 72 hours. Urine analysis: No results found for: COLORURINE, APPEARANCEUR, LABSPEC, PHURINE, GLUCOSEU, HGBUR, BILIRUBINUR, KETONESUR, PROTEINUR, UROBILINOGEN, NITRITE, LEUKOCYTESUR  Creatinine Clearance: Estimated Creatinine Clearance: 100.5 mL/min (by C-G formula based on SCr of 0.62 mg/dL).  Sepsis Labs: @LABRCNTIP (procalcitonin:4,lacticidven:4) )No results found for this or any previous visit (from the past 240 hour(s)).   Radiological Exams on Admission: Dg Chest 2 View  Result Date: 04/11/2017 CLINICAL DATA:  Transient ischemic attack. EXAM: CHEST  2 VIEW COMPARISON:  Radiographs of October 07, 2015. FINDINGS: Stable cardiomediastinal silhouette. No pneumothorax or pleural effusion is noted. Both lungs are clear. The visualized skeletal structures are unremarkable. IMPRESSION: No  active cardiopulmonary disease. Electronically Signed   By: Lupita RaiderJames  Green Jr, M.D.   On: 04/11/2017 07:30   Ct Head Wo Contrast  Result Date: 04/10/2017 CLINICAL DATA:  TIA.  Right-sided weakness and slurred speech EXAM: CT HEAD WITHOUT CONTRAST TECHNIQUE: Contiguous axial images were obtained from the base of the skull through the vertex without intravenous contrast. COMPARISON:  None available FINDINGS: Brain: No evidence of acute infarction, hemorrhage, hydrocephalus, extra-axial collection or mass lesion/mass effect. 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 Chronic small vessel ischemic type change in the cerebral white matter. Vascular: No hyperdense vessel. Skull: No acute finding Sinuses/Orbits: Negative IMPRESSION: 1. No acute finding. 2. Chronic small vessel ischemia. Electronically Signed   By: Marnee SpringJonathon  Watts M.D.   On: 04/10/2017 21:42    EKG: Pending on admission  Assessment/Plan Principal Problem:   TIA (transient ischemic attack) Active Problems:   HTN (hypertension)   Obesity   Diabetes (HCC)   Slurred speech   Right sided weakness   #1. TIA/right sided weakness/slurred speech. Likely related to uncontrolled blood pressure issues been without blood pressure medication for 3 weeks. Symptoms resolved on admission. CT of the head with no acute finding and chronic small vessel ischemia. She is provided with aspirin. -Admit to telemetry -MRI/MRA of the brain -Bilateral carotid Doppler -2-D echo -Lipid panel/hemoglobin A1c -Continue aspirin and statin - Therapy/occupational therapy -Car modified diet/heart healthy as she is passes swallow eval -Neuro evaluation/recommendations  #2. Hypertension. Uncontrolled secondary to noncompliance related to finances. Home medications include Cozaar. Patient states she cannot afford this particular medication. -continue Cozaar -consider transitioning to less expensive med at dischage -case management to  assist -heart healthy diet  #3. Diabetes. Patient reports she has been diagnosed with diabetes and is on medication however her med list is not reflect this. Serum glucose on admission 124. -Monitor -Outpatient follow-up -Follow hemoglobin A1c -nutritional consult  #4. Obesity. BMI 54.49 -Nutritional consult   DVT prophylaxis: lovenox Code Status: full  Family Communication: husband at bedside  Disposition Plan: home hopefully 24 hours  Consults called: neuro per ED MD  Admission status: obs    Gwenyth BenderBLACK,Eyal Greenhaw M MD [facility]  If 7PM-7AM, please contact night-coverage [email or web address] Password [email or web address]  04/11/2017, 7:44 AM

## 2017-04-11 NOTE — Progress Notes (Signed)
PT Cancellation Note  Patient Details Name: Laura Lam MRN: [id number] DOB: 08/18/1955   Cancelled Treatment:    Reason Eval/Treat Not Completed: Patient at procedure or test/unavailable. Pt at echo. PT will continue to f/u with pt as available and appropriate.    Alessandra BevelsJennifer M Cari Burgo 04/11/2017, 9:48 AM

## 2017-04-11 NOTE — Progress Notes (Signed)
*  Preliminary Results* Carotid artery duplex has been completed. Bilateral internal carotid arteries are near-normal with only minimal wall thickening or plaque. Vertebral arteries are patent with antegrade flow.  04/11/2017 10:20 AM  Gertie FeyMichelle Katie Moch, BS, RVT, RDCS, RDMS

## 2017-04-11 NOTE — ED Notes (Signed)
Attempted Report 

## 2017-04-11 NOTE — Progress Notes (Addendum)
NEUROHOSPITALISTS STROKE TEAM - DAILY PROGRESS NOTE   ADMISSION HISTORY: Laura Lam is an 62 y.o. female with no prior history of stroke or MI who presents to the ED after a 10-15 minute episode of dysarthria, numbness, tingling and weakness of her right face and arm. She states that she had no leg symptoms. Also with no incoordination, vision loss, confusion, aphasia or trouble walking. Her husband did not notice a facial droop but agreed with her that her speech was slurred. She states that she is back to her baseline.   SUBJECTIVE (INTERVAL HISTORY) No family is at the bedside. Patient is found laying in bed in NAD. Overall she feels her condition is completely resolved. Voices no new complaints. No new events reported overnight.   OBJECTIVE Lab Results: CBC:  Recent Labs  Lab 04/10/17 2048 04/10/17 2124  WBC  --  8.3  HGB 16.7* 15.7*  HCT 49.0* 47.8*  MCV  --  89.3  PLT  --  257   BMP: Recent Labs  Lab 04/10/17 2048 04/10/17 2124  NA 139 136  K 3.7 3.6  CL 100* 98*  CO2  --  25  GLUCOSE 124* 117*  BUN 10 9  CREATININE 0.60 0.62  CALCIUM  --  9.3   Liver Function Tests:  Recent Labs  Lab 04/10/17 2124  AST 34  ALT 35  ALKPHOS 103  BILITOT 1.0  PROT 7.5  ALBUMIN 4.2   Coagulation Studies:  Recent Labs    04/10/17 2124  APTT 29  INR 1.02   PHYSICAL EXAM Temp:  [98.1 F (36.7 C)-98.6 F (37 C)] 98.1 F (36.7 C) (01/04 0600) Pulse Rate:  [92-105] 96 (01/04 1600) Resp:  [12-25] 22 (01/04 1500) BP: (137-163)/(83-105) 143/95 (01/04 1600) SpO2:  [92 %-99 %] 92 % (01/04 1600) Weight:  [135.2 kg (298 lb)] 135.2 kg (298 lb) (01/03 2053) General - Well nourished, well developed, in no apparent distress HEENT-  Normocephalic, Normal external eye/conjunctiva.  Normal external ears. Normal external nose, mucus membranes and septum.   Cardiovascular - Regular rate and rhythm  Respiratory - Lungs clear  bilaterally. No wheezing. Abdomen - soft and non-tender, BS normal Extremities- no edema or cyanosis Mental Status -  Level of arousal and orientation to time, place, and person were intact. Language including expression, naming, repetition, comprehension was assessed and found intact. Attention span and concentration were normal Recent and remote memory were intact Fund of Knowledge was assessed and was intact Cranial Nerves II - XII - II - Visual field intact OU III, IV, VI - Extraocular movements intact. V - Facial sensation intact bilaterally. VII - Facial movement intact bilaterally VIII - Hearing & vestibular intact bilaterally X - Palate elevates symmetrically XI - Chin turning & shoulder shrug intact bilaterally. XII - Tongue protrusion intact Motor Strength - The patient's strength was normal in all extremities and pronator drift was absent.  Bulk was normal and fasciculations were absent  Motor Tone - Muscle tone was assessed at the neck and appendages and was normal Reflexes - The patient's reflexes were symmetrical in all extremities and she had no pathological reflexes Sensory - Light touch was assessed and was symmetrical Coordination - The patient had normal movements in the hands and feet with no ataxia or dysmetria.  Tremor was absent Gait and Station - deferred.  IMAGING: I have personally reviewed the radiological images below and agree with the radiology interpretations.  Ct Head Wo Contrast Result Date: 04/10/2017 IMPRESSION:  1. No acute finding. 2. Chronic small vessel ischemia. Electronically Signed   By: Marnee Spring M.D.   On: 04/10/2017 21:42   Mr Brain Wo Contrast Result Date: 04/11/2017 IMPRESSION: 1. No acute intracranial abnormality. 2. Diffuse atrophy and white matter changes are moderately advanced for age, likely reflecting the sequela of chronic microvascular ischemia. 3. Normal variant MRA circle of Willis without significant proximal stenosis,  aneurysm, or branch vessel occlusion. Electronically Signed   By: Marin Roberts M.D.   On: 04/11/2017 09:25   Mr Maxine Glenn Head Wo Contrast Result Date: 04/11/2017 IMPRESSION: 1. No acute intracranial abnormality. 2. Diffuse atrophy and white matter changes are moderately advanced for age, likely reflecting the sequela of chronic microvascular ischemia. 3. Normal variant MRA circle of Willis without significant proximal stenosis, aneurysm, or branch vessel occlusion. Electronically Signed   By: Marin Roberts M.D.   On: 04/11/2017 09:25   Echocardiogram:                                               Study Conclusions - Left ventricle: The cavity size was normal. Wall thickness was   increased in a pattern of mild LVH. There was mild concentric   hypertrophy. Systolic function was normal. Wall motion was   normal; there were no regional wall motion abnormalities. Doppler   parameters are consistent with abnormal left ventricular   relaxation (grade 1 diastolic dysfunction). Impressions:- No cardiac source of emboli was indentified.  B/L Carotid U/S:                                                 near-normal with only minimal wall thickening or plaque. Vertebral arteries are patent with antegrade flow     IMPRESSION: [name] is a 62 y.o. female with PMH of hypertension, diabetes, hyperlipidemia, morbid obesity who presents to the emergency room with reports of a 10-15-minute episode of dysarthria numbness and tingling on the right face and arm  STROKE:  Suspected Etiology: Small vessel disease Resultant Symptoms:  dysarthria, numbness, tingling and weakness of her right face and arm Stroke Risk Factors: diabetes mellitus, hyperlipidemia and hypertension Other Stroke Risk Factors: Advanced age, Morbid Obesity, Body mass index is 54.5 kg/m. , OSA/likely undiagnosed  Outstanding Stroke Work-up Studies:    Workup completed  04/11/2017 ASSESSMENT:   Neuro exam stable and  nonfocal.  MRI negative for stroke.  Echocardiogram and carotid duplex negative for acute findings.  Continue aspirin and statin.  Change home medications for blood pressure and statin to the [facility] $4 list so that patient can afford to take her medications.  Close PCP follow-up.  Follow-up with outpatient neurology in 6 weeks.  PLAN  04/11/2017: Continue Aspirin/ Statin Change home blood pressure medication and statin to medications on the [facility] $4 list as patient was not taking any of her own medications due to inability to pay for the prescriptions. Frequent neuro checks Telemetry monitoring PT/OT/SLP Consult Case Management /MSW Ongoing aggressive stroke risk factor management Patient counseled to be compliant with her antithrombotic medications Patient counseled on Lifestyle modifications including, Diet, Exercise, and Stress Follow up with [facility] in 6 weeks  HYPERTENSION: Stable Long term BP goal normotensive.  May restart B/P medications now Home Meds: Cozaar, patient will need another medication that is on the [facility] $4 list, she cannot afford the monthly payment for Cozaar  HYPERLIPIDEMIA:    Component Value Date/Time   CHOL 288 (H) 04/10/2017 2124   TRIG 154 (H) 04/10/2017 2124   HDL 68 04/10/2017 2124   CHOLHDL 4.2 04/10/2017 2124   VLDL 31 04/10/2017 2124   LDLCALC 189 (H) 04/10/2017 2124  Home Meds: Crestor 20 mg, was not taking due to financial situation LDL  goal < 70 Started on Lipitor to 40 mg daily, this is on the [facility] $4 list Continue statin at discharge  DIABETES: Lab Results  Component Value Date   HGBA1C 7.2 (H) 04/10/2017  HgbA1c goal < 7.0 Currently on: NovoLog Continue CBG monitoring and SSI to maintain glucose 140-180 mg/dl DM education   OBESITY Morbid Obesity, Body mass index is 54.5 kg/m.   Other Active Problems: Principal Problem:   TIA (transient ischemic attack) Active Problems:   HTN (hypertension)    Obesity   Diabetes (HCC)   Slurred speech   Right sided weakness    Hospital day # 0 VTE prophylaxis: Lovenox  Diet : Diet heart healthy/carb modified Room service appropriate? Yes; Fluid consistency: Thin   FAMILY UPDATES: No family at bedside  TEAM UPDATES: Dimple NanasAmin, Ankit Chirag, MD   Prior Home Stroke Medications:  No antithrombotic  Discharge Stroke Meds:  Please discharge patient on aspirin 325 mg daily   Disposition: 01-Home or Self Care Therapy Recs:               PENDING Home Equipment:         PENDING Follow Up:  Follow-up Information    Shaune PollackGates, Donna, MD. Schedule an appointment as soon as possible for a visit in 1 week(s).   Specialty:  Family Medicine Contact information: [address] [place] [address] [phone]        Nilda RiggsMartin, Nancy Carolyn, NP. Schedule an appointment as soon as possible for a visit in 6 week(s).   Specialty:  Family Medicine Contact information: [address] [place] [address] [phone]            Assessment & plan discussed with with attending physician and they are in agreement.    Brita RompMary A Costello, ANP-C Stroke Neurology Team 04/11/2017 4:50 PM  ATTENDING NOTE: I reviewed above note and agree with the assessment and plan. I have made any additions or clarifications directly to the above note. Pt was seen and examined.   62 year old female with history of hypertension, hyperlipidemia, diabetes, obesity admitted for episode of dysarthria, left facial numbness, lasting 10-15 minutes.  Currently back to baseline.  CT negative.  MRI/MRA unremarkable. EF normal.  Carotid Doppler negative.  LDL 189 and A1c 7.2.  Patient condition consistent with TIA.  And patient stated that she was not compliant with medication as 1 of her medication was very expensive.  Her on aspirin 325 and continued Crestor 40.  PT OT no follow-up.  Educated patient on medication compliance.  Recommend primary team to  discharge patient with medications that patient can afford.  Neurology will sign off. Please call with questions. [facility]. Thanks for the consult.   Marvel PlanJindong Maximilliano Kersh, MD PhD Stroke Neurology 04/11/2017 9:17 PM     Neurology to sign-off at this time. Please call with any further questions or concerns. Thank  you for this consultation.  To contact Stroke Continuity provider, please refer to [email or web address]. After hours, contact General Neurology

## 2017-04-11 NOTE — ED Notes (Signed)
PT at bedside for evaluation.

## 2017-04-11 NOTE — ED Notes (Signed)
ED Provider at bedside. 

## 2017-04-11 NOTE — ED Notes (Signed)
Vascular Tech called at this time.  Coordination of care attempted for completion of Echo and Carotid US, Vasc. Tech to assist in expedition of exams.  Pt remains in MRI at this time.

## 2017-04-11 NOTE — ED Provider Notes (Signed)
[facility] EMERGENCY DEPARTMENT Provider Note   CSN: [id number] Arrival date & time: 04/10/17  2010     History   Chief Complaint Chief Complaint  Patient presents with  . Hypertension    HPI Laura Lam is a 62 y.o. female.  Patient presents to the emergency department for evaluation of speech disturbance with right-sided weakness.  Patient had sudden onset of numbness, tingling and weakness of her right arm and right face.  At that time her husband noticed that her speech was slurring.  Symptoms lasted for 10 or 15 minutes and then resolved.  She feels like she is back to her normal baseline.      Past Medical History:  Diagnosis Date  . Asthma   . Diabetes (HCC)   . High cholesterol   . Hypertension   . Obesity     Patient Active Problem List   Diagnosis Date Noted  . Sleep apnea 05/02/2014  . Diplopia 03/07/2014  . Hypertension   . Diabetes (HCC)   . High cholesterol   . Diabetes mellitus 03/28/2011  . Obesity 03/28/2011  . Asthma exacerbation 03/26/2011  . HTN (hypertension) 03/26/2011  . Hypokalemia 03/26/2011  . Hyperglycemia 03/26/2011    Past Surgical History:  Procedure Laterality Date  . FINGER SURGERY Left   . TONSILLECTOMY AND ADENOIDECTOMY      OB History    No data available       Home Medications    Prior to Admission medications   Medication Sig Start Date End Date Taking? Authorizing Provider  clopidogrel (PLAVIX) 75 MG tablet Take 1 tablet (75 mg total) by mouth daily. Please call office for an appointment. Patient not taking: Reported on 10/08/2015 03/20/15   Levert Feinstein, MD  cyclobenzaprine (FLEXERIL) 10 MG tablet Take 0.5-1 tablets (5-10 mg total) by mouth 2 (two) times daily as needed. 10/08/15   Marlon Pel, PA-C  losartan (COZAAR) 50 MG tablet Take 50 mg by mouth daily.      [provider]  naproxen (NAPROSYN) 500 MG tablet Take 1 tablet (500 mg total) by mouth 2 (two) times daily. 10/08/15    Marlon Pel, PA-C  predniSONE (DELTASONE) 10 MG tablet Take 2 tablets (20 mg total) by mouth daily. 10/08/15   Marlon Pel, PA-C  rosuvastatin (CRESTOR) 20 MG tablet Take 20 mg by mouth daily.    [provider]  venlafaxine (EFFEXOR) 25 MG tablet Take 25 mg by mouth daily.    [provider]    Family History Family History  Problem Relation Age of Onset  . Heart failure Father   . Colon cancer Father   . Dementia Father     Social History Social History   Tobacco Use  . Smoking status: Former Smoker    Packs/day: 0.00    Last attempt to quit: 03/20/2003    Years since quitting: 14.0  . Smokeless tobacco: Never Used  Substance Use Topics  . Alcohol use: No    Alcohol/week: 0.0 oz  . Drug use: No     Allergies   Azithromycin and Demerol   Review of Systems Review of Systems  Neurological: Positive for speech difficulty, weakness and numbness.  All other systems reviewed and are negative.    Physical Exam Updated Vital Signs BP (!) 161/98 (BP Location: Right Arm)   Pulse 92   Temp 98.2 F (36.8 C) (Oral)   Resp 18   Ht 5\' 2"  (1.575 m)   Wt 135.2  kg (298 lb)   SpO2 99%   BMI 54.50 kg/m   Physical Exam  Constitutional: She is oriented to person, place, and time. She appears well-developed and well-nourished. No distress.  HENT:  Head: Normocephalic and atraumatic.  Right Ear: Hearing normal.  Left Ear: Hearing normal.  Nose: Nose normal.  Mouth/Throat: Oropharynx is clear and moist and mucous membranes are normal.  Eyes: Conjunctivae and EOM are normal. Pupils are equal, round, and reactive to light.  Neck: Normal range of motion. Neck supple.  Cardiovascular: Regular rhythm, S1 normal and S2 normal. Exam reveals no gallop and no friction rub.  No murmur heard. Pulmonary/Chest: Effort normal and breath sounds normal. No respiratory distress. She exhibits no tenderness.  Abdominal: Soft. Normal appearance and bowel sounds are  normal. There is no hepatosplenomegaly. There is no tenderness. There is no rebound, no guarding, no tenderness at McBurney's point and negative Murphy's sign. No hernia.  Musculoskeletal: Normal range of motion.  Extraocular muscle movement: normal No visual field cut Pupils: equal and reactive both direct and consensual response is normal No nystagmus present    Sensory function is intact to light touch, pinprick Proprioception intact  Grip strength 5/5 symmetric in upper extremities No pronator drift Normal finger to nose bilaterally  Lower extremity strength 5/5 against gravity Normal heel to shin bilaterally     Neurological: She is alert and oriented to person, place, and time. She has normal strength. No cranial nerve deficit or sensory deficit. Coordination normal. GCS eye subscore is 4. GCS verbal subscore is 5. GCS motor subscore is 6.  Skin: Skin is warm, dry and intact. No rash noted. No cyanosis.  Psychiatric: She has a normal mood and affect. Her speech is normal and behavior is normal. Thought content normal.  Nursing note and vitals reviewed.    ED Treatments / Results  Labs (all labs ordered are listed, but only abnormal results are displayed) Labs Reviewed  CBC - Abnormal; Notable for the following components:      Result Value   RBC 5.35 (*)    Hemoglobin 15.7 (*)    HCT 47.8 (*)    All other components within normal limits  COMPREHENSIVE METABOLIC PANEL - Abnormal; Notable for the following components:   Chloride 98 (*)    Glucose, Bld 117 (*)    All other components within normal limits  I-STAT CHEM 8, ED - Abnormal; Notable for the following components:   Chloride 100 (*)    Glucose, Bld 124 (*)    Calcium, Ion 1.09 (*)    Hemoglobin 16.7 (*)    HCT 49.0 (*)    All other components within normal limits  PROTIME-INR  APTT  DIFFERENTIAL  I-STAT TROPONIN, ED  I-STAT BETA HCG BLOOD, ED ([facility], [facility], [facility] ONLY)    EKG  EKG Interpretation None        Radiology Ct Head Wo Contrast  Result Date: 04/10/2017 CLINICAL DATA:  TIA.  Right-sided weakness and slurred speech EXAM: CT HEAD WITHOUT CONTRAST TECHNIQUE: Contiguous axial images were obtained from the base of the skull through the vertex without intravenous contrast. COMPARISON:  None available FINDINGS: Brain: No evidence of acute infarction, hemorrhage, hydrocephalus, extra-axial collection or mass lesion/mass effect. 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 Chronic small vessel ischemic type change in the cerebral white matter. Vascular: No hyperdense vessel. Skull: No acute finding Sinuses/Orbits: Negative IMPRESSION: 1. No acute finding. 2. Chronic small vessel ischemia. Electronically  Signed   By: Marnee Spring M.D.   On: 04/10/2017 21:42    Procedures Procedures (including critical care time)  Medications Ordered in ED Medications - No data to display   Initial Impression / Assessment and Plan / ED Course  I have reviewed the triage vital signs and the nursing notes.  Pertinent labs & imaging results that were available during my care of the patient were reviewed by me and considered in my medical decision making (see chart for details).     Patient presents to the emergency department for evaluation of acute onset of slurred speech with right-sided weakness.  This occurred earlier tonight.  Patient does have cardiovascular risk factors.  She does not, however, have a history of stroke or vascular disease.  She has not been able to purchase her blood pressure medicine recently, blood pressure has been running high.  Symptoms that occur today sound very convincing for TIA.  She will require hospitalization for TIA workup.  Final Clinical Impressions(s) / ED Diagnoses   Final diagnoses:  TIA (transient ischemic attack)    ED Discharge Orders    None       Tilden Broz, Canary Brim, MD 04/11/17 [id number]

## 2017-04-11 NOTE — Progress Notes (Signed)
SLP Cancellation Note  Patient Details Name: Sharl Maracey Vanwyhe MRN: [id number] DOB: 04/18/1955   Cancelled treatment:       Reason Eval/Treat Not Completed: SLP screened, no needs identified, will sign off   Evarose Altland 04/11/2017, 3:40 PM

## 2017-04-11 NOTE — Progress Notes (Signed)
  Echocardiogram 2D Echocardiogram has been performed.  Ruthella Kirchman G Gwendy Boeder 04/11/2017, 10:04 AM

## 2017-04-12 DIAGNOSIS — E6609 Other obesity due to excess calories: Secondary | ICD-10-CM

## 2017-04-12 DIAGNOSIS — Z6833 Body mass index (BMI) 33.0-33.9, adult: Secondary | ICD-10-CM

## 2017-04-12 DIAGNOSIS — I10 Essential (primary) hypertension: Secondary | ICD-10-CM

## 2017-04-12 DIAGNOSIS — E1159 Type 2 diabetes mellitus with other circulatory complications: Secondary | ICD-10-CM

## 2017-04-12 LAB — GLUCOSE, CAPILLARY: Glucose-Capillary: 193 mg/dL — ABNORMAL HIGH (ref 65–99)

## 2017-04-12 MED ORDER — VENLAFAXINE HCL ER 75 MG PO CP24
75.0000 mg | ORAL_CAPSULE | Freq: Once | ORAL | Status: AC
  Administered 2017-04-12: 75 mg via ORAL
  Filled 2017-04-12: qty 1

## 2017-04-12 MED ORDER — CYCLOBENZAPRINE HCL 10 MG PO TABS: 5.0000 mg | ORAL_TABLET | Freq: Two times a day (BID) | ORAL | 0 refills | Status: AC | PRN

## 2017-04-12 MED ORDER — VENLAFAXINE HCL ER 75 MG PO CP24: 150.0000 mg | ORAL_CAPSULE | Freq: Every day | ORAL | Status: DC

## 2017-04-12 MED ORDER — ASPIRIN 325 MG PO TABS: 325.0000 mg | ORAL_TABLET | Freq: Every day | ORAL | 3 refills | Status: AC

## 2017-04-12 MED ORDER — VENLAFAXINE HCL 37.5 MG PO TABS: 150.0000 mg | ORAL_TABLET | Freq: Every day | ORAL | Status: DC

## 2017-04-12 MED ORDER — GLIPIZIDE ER 10 MG PO TB24
10.0000 mg | ORAL_TABLET | Freq: Every day | ORAL | Status: DC
  Administered 2017-04-12: 10 mg via ORAL
  Filled 2017-04-12: qty 1

## 2017-04-12 MED ORDER — BENAZEPRIL HCL 20 MG PO TABS: 20.0000 mg | ORAL_TABLET | Freq: Every day | ORAL | 4 refills | Status: AC

## 2017-04-12 MED ORDER — GLIPIZIDE ER 10 MG PO TB24: 10.0000 mg | ORAL_TABLET | Freq: Every day | ORAL | 3 refills | Status: AC

## 2017-04-12 MED ORDER — VENLAFAXINE HCL ER 150 MG PO CP24: 150.0000 mg | ORAL_CAPSULE | Freq: Every day | ORAL | 0 refills | Status: AC

## 2017-04-12 MED ORDER — LOVASTATIN 40 MG PO TABS: 40.0000 mg | ORAL_TABLET | Freq: Every day | ORAL | 3 refills | Status: AC

## 2017-04-12 NOTE — Care Management (Signed)
PT is independent from home.  PCP Dr Shaune Pollackonna Gates - pt would like to remain with established PCP.  Pt confirmed she does not have active insurance coverage and states she pays for medications out of pocket.  Pt will be discharged home on 2 prescription meds - CM confirmed that the approximate cost with coupons will be less than $30 - pt declined MATCH program and states she can afford the additional cost.  CM provided coupons to pt as requested.  No further CM needs determined at the time this note was written - CM signing off

## 2017-04-12 NOTE — Progress Notes (Signed)
Pt discharge education and instructions completed with pt and family to the side. Pt handed her prescriptions and documents provided by CM. Pt IV and telemetry; pt discharge home with family to transport her home. Pt offered wheelchair at discharge but she declined and ambulated off unit with family and belongings to the side. Laura BucyP. Amo Zamarion Longest RN

## 2017-04-12 NOTE — Discharge Summary (Signed)
Physician Discharge Summary   Patient ID: Laura Lam MRN: [id number] DOB/AGE: 60-03-1956 60 y.o.  Admit date: 04/11/2017 Discharge date: 04/12/2017  Primary Care Physician:  Shaune Pollack, MD  Discharge Diagnoses:    . TIA (transient ischemic attack) . HTN (hypertension) . Obesity . Diabetes mellitus type 2   Consults: Neurology  Recommendations for Outpatient Follow-up:  1. Patient requested [facility] medication list due to financial reasons.  Placed on aspirin 325 mg daily, Mevacor, benazepril per the [facility] list. 2. Please repeat CBC/BMET at next visit   DIET: Carb modified diet    Allergies:   Allergies  Allergen Reactions  . Azithromycin Other (See Comments)    Stomach pain  . Demerol Other (See Comments)    Low bp     DISCHARGE MEDICATIONS: Allergies as of 04/12/2017      Reactions   Azithromycin Other (See Comments)   Stomach pain   Demerol Other (See Comments)   Low bp      Medication List    STOP taking these medications   CRESTOR 20 MG tablet Generic drug:  rosuvastatin   losartan 50 MG tablet Commonly known as:  COZAAR     TAKE these medications   albuterol 108 (90 Base) MCG/ACT inhaler Commonly known as:  PROVENTIL HFA;VENTOLIN HFA Inhale 1-2 puffs into the lungs every 6 (six) hours as needed for wheezing or shortness of breath.   aspirin 325 MG tablet Take 1 tablet (325 mg total) by mouth daily. Start taking on:  04/13/2017   benazepril 20 MG tablet Commonly known as:  LOTENSIN Take 1 tablet (20 mg total) by mouth daily.   cyclobenzaprine 10 MG tablet Commonly known as:  FLEXERIL Take 0.5-1 tablets (5-10 mg total) by mouth 2 (two) times daily as needed for muscle spasms.   glipiZIDE 10 MG 24 hr tablet Commonly known as:  GLUCOTROL XL Take 1 tablet (10 mg total) by mouth daily with breakfast.   lovastatin 40 MG tablet Commonly known as:  MEVACOR Take 1 tablet (40 mg total) by mouth daily.   venlafaxine XR 150 MG 24 hr  capsule Commonly known as:  EFFEXOR-XR Take 1 capsule (150 mg total) by mouth daily with breakfast.        Brief H and P: For complete details please refer to admission H and P, but in brief she is a 62 year old female with obesity, asthma, diabetes type 2, hypertension presented with slurred speech, right-sided arm weakness, facial numbness.  Symptoms lasted for less than 15 minutes.  Patient reported that her BP was elevated at 162/105.  She has not been able to afford losartan.  Patient was admitted for stroke workup.  Hospital Course:     TIA (transient ischemic attack) -Symptoms resolved, MRI of the brain negative for CVA -MRA showed normal variant MRA circle of Willis without proximal stenosis, aneurysm or branch vessel occlusion -Placed on aspirin 325 mg daily -2D echo showed EF normal, grade 1 diastolic dysfunction, no regional wall motion abnormalities -Carotid Doppler showed no significant stenosis or occlusion -LDL 189, placed on lovastatin 40 mg daily -PT OT evaluation recommended no PT follow-up, back to baseline    HTN (hypertension) -Currently improving, patient had not been able to afford losartan, change to benazepril on the [facility] $4 list    Obesity, morbid -BMI 54.49, counseled patient on diet and weight control    Diabetes mellitus type II (HCC)  -Hemoglobin A1c 7.2, patient to continue glipizide and follow-up with her PCP for better  glycemic control.  She has not tolerated metformin in the past.    Hyperlipidemia -LDL 189, placed on statin   Day of Discharge BP 128/77 (BP Location: Right Arm)   Pulse 96   Temp 97.7 F (36.5 C) (Axillary)   Resp 18   Ht 5\' 2"  (1.575 m)   Wt 135.2 kg (298 lb)   SpO2 96%   BMI 54.50 kg/m   Physical Exam: General: Alert and awake oriented x3 not in any acute distress. HEENT: anicteric sclera, pupils reactive to light and accommodation CVS: S1-S2 clear no murmur rubs or gallops Chest: clear to auscultation  bilaterally, no wheezing rales or rhonchi Abdomen: Obese, soft nontender, nondistended, normal bowel sounds Extremities: no cyanosis, clubbing or edema noted bilaterally Neuro: Cranial nerves II-XII intact, no focal neurological deficits   The results of significant diagnostics from this hospitalization (including imaging, microbiology, ancillary and laboratory) are listed below for reference.    LAB RESULTS: Basic Metabolic Panel: Recent Labs  Lab 04/10/17 2048 04/10/17 2124  NA 139 136  K 3.7 3.6  CL 100* 98*  CO2  --  25  GLUCOSE 124* 117*  BUN 10 9  CREATININE 0.60 0.62  CALCIUM  --  9.3   Liver Function Tests: Recent Labs  Lab 04/10/17 2124  AST 34  ALT 35  ALKPHOS 103  BILITOT 1.0  PROT 7.5  ALBUMIN 4.2   No results for input(s): LIPASE, AMYLASE in the last 168 hours. No results for input(s): AMMONIA in the last 168 hours. CBC: Recent Labs  Lab 04/10/17 2048 04/10/17 2124  WBC  --  8.3  NEUTROABS  --  5.3  HGB 16.7* 15.7*  HCT 49.0* 47.8*  MCV  --  89.3  PLT  --  257   Cardiac Enzymes: No results for input(s): CKTOTAL, CKMB, CKMBINDEX, TROPONINI in the last 168 hours. BNP: Invalid input(s): POCBNP CBG: Recent Labs  Lab 04/11/17 2130 04/12/17 0651  GLUCAP 186* 193*    Significant Diagnostic Studies:  Dg Chest 2 View  Result Date: 04/11/2017 CLINICAL DATA:  Transient ischemic attack. EXAM: CHEST  2 VIEW COMPARISON:  Radiographs of October 07, 2015. FINDINGS: Stable cardiomediastinal silhouette. No pneumothorax or pleural effusion is noted. Both lungs are clear. The visualized skeletal structures are unremarkable. IMPRESSION: No active cardiopulmonary disease. Electronically Signed   By: Lupita Raider, M.D.   On: 04/11/2017 07:30   Mr Brain Wo Contrast  Result Date: 04/11/2017 CLINICAL DATA:  Transient dysarthria. Episode of right arm weakness and sensory numbness. TIA, initial exam. The episode lasted 15 minutes. The patient has returned to baseline.  EXAM: MRI HEAD WITHOUT CONTRAST MRA HEAD WITHOUT CONTRAST TECHNIQUE: Multiplanar, multiecho pulse sequences of the brain and surrounding structures were obtained without intravenous contrast. Angiographic images of the head were obtained using MRA technique without contrast. COMPARISON:  CT of the head from the same day. Report of previous MRI and MRA head 03/23/2014. The images are not currently available. FINDINGS: MRI HEAD FINDINGS Brain: The diffusion-weighted images demonstrate no acute or subacute infarction. No acute hemorrhage or mass lesion is present. Periventricular and subcortical T2 hyperintensities are moderately advanced for age. Dilated perivascular spaces are present throughout the basal ganglia. White matter changes extend into the brainstem. The cerebellum is unremarkable. The internal auditory canals are within normal limits for age. The internal auditory canals are unremarkable. Vascular: Flow is present in the major intracranial arteries. Skull and upper cervical spine: Skullbase is within normal limits. The craniocervical  junction is normal. Midline sagittal structures are unremarkable. Sinuses/Orbits: The paranasal sinuses and mastoid air cells are clear. Globes and orbits are within normal limits. MRA HEAD FINDINGS The internal carotid artery is are within normal limits from the high cervical segments through the ICA termini bilaterally. The A1 and M1 segments are normal. The anterior communicating artery is patent. MCA bifurcations are intact. ACA and MCA branch vessels are within normal limits. The left vertebral artery is the dominant vessel. The PICA origin is not visualized on either side. Prominent AICA vessels are noted. The basilar artery is normal. Both posterior cerebral arteries originate from the basilar tip. PCA branch vessels are within normal limits. IMPRESSION: 1. No acute intracranial abnormality. 2. Diffuse atrophy and white matter changes are moderately advanced for age,  likely reflecting the sequela of chronic microvascular ischemia. 3. Normal variant MRA circle of Willis without significant proximal stenosis, aneurysm, or branch vessel occlusion. Electronically Signed   By: Marin Robertshristopher  Mattern M.D.   On: 04/11/2017 09:25   Mr Maxine GlennMra Head Wo Contrast  Result Date: 04/11/2017 CLINICAL DATA:  Transient dysarthria. Episode of right arm weakness and sensory numbness. TIA, initial exam. The episode lasted 15 minutes. The patient has returned to baseline. EXAM: MRI HEAD WITHOUT CONTRAST MRA HEAD WITHOUT CONTRAST TECHNIQUE: Multiplanar, multiecho pulse sequences of the brain and surrounding structures were obtained without intravenous contrast. Angiographic images of the head were obtained using MRA technique without contrast. COMPARISON:  CT of the head from the same day. Report of previous MRI and MRA head 03/23/2014. The images are not currently available. FINDINGS: MRI HEAD FINDINGS Brain: The diffusion-weighted images demonstrate no acute or subacute infarction. No acute hemorrhage or mass lesion is present. Periventricular and subcortical T2 hyperintensities are moderately advanced for age. Dilated perivascular spaces are present throughout the basal ganglia. White matter changes extend into the brainstem. The cerebellum is unremarkable. The internal auditory canals are within normal limits for age. The internal auditory canals are unremarkable. Vascular: Flow is present in the major intracranial arteries. Skull and upper cervical spine: Skullbase is within normal limits. The craniocervical junction is normal. Midline sagittal structures are unremarkable. Sinuses/Orbits: The paranasal sinuses and mastoid air cells are clear. Globes and orbits are within normal limits. MRA HEAD FINDINGS The internal carotid artery is are within normal limits from the high cervical segments through the ICA termini bilaterally. The A1 and M1 segments are normal. The anterior communicating artery is  patent. MCA bifurcations are intact. ACA and MCA branch vessels are within normal limits. The left vertebral artery is the dominant vessel. The PICA origin is not visualized on either side. Prominent AICA vessels are noted. The basilar artery is normal. Both posterior cerebral arteries originate from the basilar tip. PCA branch vessels are within normal limits. IMPRESSION: 1. No acute intracranial abnormality. 2. Diffuse atrophy and white matter changes are moderately advanced for age, likely reflecting the sequela of chronic microvascular ischemia. 3. Normal variant MRA circle of Willis without significant proximal stenosis, aneurysm, or branch vessel occlusion. Electronically Signed   By: Marin Robertshristopher  Mattern M.D.   On: 04/11/2017 09:25    2D ECHO: Study Conclusions  - Left ventricle: The cavity size was normal. Wall thickness was   increased in a pattern of mild LVH. There was mild concentric   hypertrophy. Systolic function was normal. Wall motion was   normal; there were no regional wall motion abnormalities. Doppler   parameters are consistent with abnormal left ventricular   relaxation (grade  1 diastolic dysfunction).  Impressions:  - No cardiac source of emboli was indentified.  Disposition and Follow-up: Discharge Instructions    Ambulatory referral to Neurology   Complete by:  As directed    An appointment is requested in approximately: 6 weeks Follow up with stroke clinic Darrol Angel preferred, if not available, then consider Sylvie Farrier, [facility] or Lucia Gaskins whoever is available) at [facility] in about 6-8 weeks. Thanks.   Ambulatory referral to Neurology   Complete by:  As directed    An appointment is requested in approximately: 4-6 Week(s): for TIA   Diet Carb Modified   Complete by:  As directed    Increase activity slowly   Complete by:  As directed        DISPOSITION: Home    DISCHARGE FOLLOW-UP Follow-up Information    Shaune Pollack, MD. Schedule an appointment as  soon as possible for a visit in 1 week(s).   Specialty:  Family Medicine Contact information: [address] [place] [address] [phone]        Nilda Riggs, NP. Schedule an appointment as soon as possible for a visit in 6 week(s).   Specialty:  Family Medicine Contact information: [address] [place] [address] [phone]            Time spent on Discharge:   Signed:   Thad Ranger M.D. [facility] 04/12/2017, 11:07 AM Pager: [phone]

## 2017-04-12 NOTE — Progress Notes (Signed)
OT Cancellation Note  Patient Details Name: Sharl Maracey Morey MRN: [id number] DOB: 08/24/1955   Cancelled Treatment:    Reason Eval/Treat Not Completed: OT screened, no needs identified, will sign off. Per chart review and conversation with pt, pt is at baseline with ADLs and feels her syptoms have resolved. Awaiting d/c home.   Pilar GrammesMathews, Lakyn Alsteen H 04/12/2017, 10:22 AM

## 2022-02-25 ENCOUNTER — Telehealth: Payer: Self-pay

## 2022-02-25 NOTE — Telephone Encounter (Signed)
Telephoned patient at mobile number. Left a voice message with [facility] contact information. 

## 2023-02-25 DIAGNOSIS — E7800 Pure hypercholesterolemia, unspecified: Secondary | ICD-10-CM | POA: Diagnosis not present

## 2023-02-25 DIAGNOSIS — E559 Vitamin D deficiency, unspecified: Secondary | ICD-10-CM | POA: Diagnosis not present

## 2023-02-25 DIAGNOSIS — E119 Type 2 diabetes mellitus without complications: Secondary | ICD-10-CM | POA: Diagnosis not present

## 2023-02-25 DIAGNOSIS — I10 Essential (primary) hypertension: Secondary | ICD-10-CM | POA: Diagnosis not present

## 2023-02-28 ENCOUNTER — Other Ambulatory Visit: Payer: Self-pay | Admitting: Family Medicine

## 2023-02-28 ENCOUNTER — Other Ambulatory Visit (HOSPITAL_COMMUNITY)
Admission: RE | Admit: 2023-02-28 | Discharge: 2023-02-28 | Disposition: A | Discharge status: Home / Self Care | Payer: PPO | Source: Ambulatory Visit | Attending: Family Medicine | Admitting: Family Medicine

## 2023-02-28 DIAGNOSIS — Z0000 Encounter for general adult medical examination without abnormal findings: Secondary | ICD-10-CM | POA: Diagnosis not present

## 2023-02-28 DIAGNOSIS — Z01419 Encounter for gynecological examination (general) (routine) without abnormal findings: Secondary | ICD-10-CM | POA: Insufficient documentation

## 2023-02-28 DIAGNOSIS — F320 Major depressive disorder, single episode, mild: Secondary | ICD-10-CM | POA: Diagnosis not present

## 2023-02-28 DIAGNOSIS — Z1151 Encounter for screening for human papillomavirus (HPV): Secondary | ICD-10-CM | POA: Insufficient documentation

## 2023-02-28 DIAGNOSIS — Z6841 Body Mass Index (BMI) 40.0 and over, adult: Secondary | ICD-10-CM | POA: Diagnosis not present

## 2023-02-28 DIAGNOSIS — I10 Essential (primary) hypertension: Secondary | ICD-10-CM | POA: Diagnosis not present

## 2023-02-28 DIAGNOSIS — Z124 Encounter for screening for malignant neoplasm of cervix: Secondary | ICD-10-CM | POA: Diagnosis not present

## 2023-02-28 DIAGNOSIS — E2839 Other primary ovarian failure: Secondary | ICD-10-CM | POA: Diagnosis not present

## 2023-02-28 DIAGNOSIS — J45909 Unspecified asthma, uncomplicated: Secondary | ICD-10-CM | POA: Diagnosis not present

## 2023-02-28 DIAGNOSIS — E7800 Pure hypercholesterolemia, unspecified: Secondary | ICD-10-CM | POA: Diagnosis not present

## 2023-02-28 DIAGNOSIS — Z23 Encounter for immunization: Secondary | ICD-10-CM | POA: Diagnosis not present

## 2023-02-28 DIAGNOSIS — Z01411 Encounter for gynecological examination (general) (routine) with abnormal findings: Secondary | ICD-10-CM | POA: Diagnosis present

## 2023-02-28 DIAGNOSIS — Z1231 Encounter for screening mammogram for malignant neoplasm of breast: Secondary | ICD-10-CM

## 2023-02-28 DIAGNOSIS — E119 Type 2 diabetes mellitus without complications: Secondary | ICD-10-CM | POA: Diagnosis not present

## 2023-03-04 LAB — CYTOLOGY - PAP
Comment: NEGATIVE
Diagnosis: NEGATIVE
High risk HPV: NEGATIVE

## 2023-04-03 ENCOUNTER — Encounter: Payer: Self-pay | Admitting: Radiology

## 2023-04-03 ENCOUNTER — Ambulatory Visit
Admission: RE | Admit: 2023-04-03 | Discharge: 2023-04-03 | Disposition: A | Discharge status: Home / Self Care | Payer: PPO | Source: Ambulatory Visit | Attending: Family Medicine | Admitting: Family Medicine

## 2023-04-03 DIAGNOSIS — Z1231 Encounter for screening mammogram for malignant neoplasm of breast: Secondary | ICD-10-CM

## 2023-06-19 DIAGNOSIS — M25562 Pain in left knee: Secondary | ICD-10-CM | POA: Diagnosis not present

## 2023-07-08 DIAGNOSIS — M25562 Pain in left knee: Secondary | ICD-10-CM | POA: Diagnosis not present

## 2023-07-15 DIAGNOSIS — K5730 Diverticulosis of large intestine without perforation or abscess without bleeding: Secondary | ICD-10-CM | POA: Diagnosis not present

## 2023-07-15 DIAGNOSIS — K5520 Angiodysplasia of colon without hemorrhage: Secondary | ICD-10-CM | POA: Diagnosis not present

## 2023-07-15 DIAGNOSIS — Z1211 Encounter for screening for malignant neoplasm of colon: Secondary | ICD-10-CM | POA: Diagnosis not present

## 2023-07-15 DIAGNOSIS — Z800 Family history of malignant neoplasm of digestive organs: Secondary | ICD-10-CM | POA: Diagnosis not present

## 2023-09-02 DIAGNOSIS — E66813 Obesity, class 3: Secondary | ICD-10-CM | POA: Diagnosis not present

## 2023-09-02 DIAGNOSIS — E119 Type 2 diabetes mellitus without complications: Secondary | ICD-10-CM | POA: Diagnosis not present

## 2023-09-02 DIAGNOSIS — F322 Major depressive disorder, single episode, severe without psychotic features: Secondary | ICD-10-CM | POA: Diagnosis not present

## 2023-09-02 DIAGNOSIS — Z7689 Persons encountering health services in other specified circumstances: Secondary | ICD-10-CM | POA: Diagnosis not present

## 2023-09-02 DIAGNOSIS — Z6841 Body Mass Index (BMI) 40.0 and over, adult: Secondary | ICD-10-CM | POA: Diagnosis not present

## 2023-09-02 DIAGNOSIS — I10 Essential (primary) hypertension: Secondary | ICD-10-CM | POA: Diagnosis not present

## 2023-09-09 DIAGNOSIS — M25562 Pain in left knee: Secondary | ICD-10-CM | POA: Diagnosis not present

## 2023-10-14 DIAGNOSIS — S80861A Insect bite (nonvenomous), right lower leg, initial encounter: Secondary | ICD-10-CM | POA: Diagnosis not present

## 2023-10-14 DIAGNOSIS — W57XXXA Bitten or stung by nonvenomous insect and other nonvenomous arthropods, initial encounter: Secondary | ICD-10-CM | POA: Diagnosis not present

## 2023-10-28 DIAGNOSIS — M1712 Unilateral primary osteoarthritis, left knee: Secondary | ICD-10-CM | POA: Diagnosis not present

## 2023-10-28 DIAGNOSIS — M25562 Pain in left knee: Secondary | ICD-10-CM | POA: Diagnosis not present

## 2023-11-04 DIAGNOSIS — Z6841 Body Mass Index (BMI) 40.0 and over, adult: Secondary | ICD-10-CM | POA: Diagnosis not present

## 2023-11-04 DIAGNOSIS — E119 Type 2 diabetes mellitus without complications: Secondary | ICD-10-CM | POA: Diagnosis not present

## 2023-11-04 DIAGNOSIS — E66813 Obesity, class 3: Secondary | ICD-10-CM | POA: Diagnosis not present

## 2023-11-04 DIAGNOSIS — M25562 Pain in left knee: Secondary | ICD-10-CM | POA: Diagnosis not present

## 2023-12-11 DIAGNOSIS — E119 Type 2 diabetes mellitus without complications: Secondary | ICD-10-CM | POA: Diagnosis not present

## 2023-12-11 DIAGNOSIS — Z23 Encounter for immunization: Secondary | ICD-10-CM | POA: Diagnosis not present

## 2024-01-01 DIAGNOSIS — M25562 Pain in left knee: Secondary | ICD-10-CM | POA: Diagnosis not present

## 2024-01-29 DIAGNOSIS — M25562 Pain in left knee: Secondary | ICD-10-CM | POA: Diagnosis not present

## 2024-03-01 DIAGNOSIS — M1712 Unilateral primary osteoarthritis, left knee: Secondary | ICD-10-CM | POA: Diagnosis not present

## 2024-03-08 DIAGNOSIS — M25562 Pain in left knee: Secondary | ICD-10-CM | POA: Diagnosis not present

## 2024-03-11 ENCOUNTER — Other Ambulatory Visit: Payer: Self-pay | Admitting: Family Medicine

## 2024-03-11 DIAGNOSIS — Z0000 Encounter for general adult medical examination without abnormal findings: Secondary | ICD-10-CM | POA: Diagnosis not present

## 2024-03-11 DIAGNOSIS — E66813 Obesity, class 3: Secondary | ICD-10-CM | POA: Diagnosis not present

## 2024-03-11 DIAGNOSIS — J45909 Unspecified asthma, uncomplicated: Secondary | ICD-10-CM | POA: Diagnosis not present

## 2024-03-11 DIAGNOSIS — E2839 Other primary ovarian failure: Secondary | ICD-10-CM | POA: Diagnosis not present

## 2024-03-11 DIAGNOSIS — I10 Essential (primary) hypertension: Secondary | ICD-10-CM | POA: Diagnosis not present

## 2024-03-11 DIAGNOSIS — E119 Type 2 diabetes mellitus without complications: Secondary | ICD-10-CM | POA: Diagnosis not present

## 2024-03-11 DIAGNOSIS — R251 Tremor, unspecified: Secondary | ICD-10-CM | POA: Diagnosis not present

## 2024-03-11 DIAGNOSIS — E7800 Pure hypercholesterolemia, unspecified: Secondary | ICD-10-CM | POA: Diagnosis not present

## 2024-03-11 DIAGNOSIS — Z6841 Body Mass Index (BMI) 40.0 and over, adult: Secondary | ICD-10-CM | POA: Diagnosis not present

## 2024-03-11 DIAGNOSIS — Z1159 Encounter for screening for other viral diseases: Secondary | ICD-10-CM | POA: Diagnosis not present

## 2024-03-11 DIAGNOSIS — Z23 Encounter for immunization: Secondary | ICD-10-CM | POA: Diagnosis not present

## 2024-03-11 DIAGNOSIS — Z1231 Encounter for screening mammogram for malignant neoplasm of breast: Secondary | ICD-10-CM

## 2024-03-11 DIAGNOSIS — F322 Major depressive disorder, single episode, severe without psychotic features: Secondary | ICD-10-CM | POA: Diagnosis not present

## 2024-03-15 DIAGNOSIS — M1712 Unilateral primary osteoarthritis, left knee: Secondary | ICD-10-CM | POA: Diagnosis not present

## 2024-04-09 ENCOUNTER — Ambulatory Visit
Admission: RE | Admit: 2024-04-09 | Discharge: 2024-04-09 | Disposition: A | Discharge status: Home / Self Care | Source: Ambulatory Visit | Attending: Family Medicine | Admitting: Family Medicine

## 2024-04-09 DIAGNOSIS — Z1231 Encounter for screening mammogram for malignant neoplasm of breast: Secondary | ICD-10-CM

## 2024-04-14 ENCOUNTER — Other Ambulatory Visit: Payer: Self-pay | Admitting: Family Medicine

## 2024-04-14 DIAGNOSIS — R928 Other abnormal and inconclusive findings on diagnostic imaging of breast: Secondary | ICD-10-CM

## 2024-04-23 ENCOUNTER — Other Ambulatory Visit: Payer: Self-pay | Admitting: Family Medicine

## 2024-04-23 ENCOUNTER — Inpatient Hospital Stay: Admission: RE | Admit: 2024-04-23

## 2024-04-23 DIAGNOSIS — R928 Other abnormal and inconclusive findings on diagnostic imaging of breast: Secondary | ICD-10-CM

## 2024-04-23 DIAGNOSIS — R921 Mammographic calcification found on diagnostic imaging of breast: Secondary | ICD-10-CM

## 2024-04-30 ENCOUNTER — Ambulatory Visit
Admission: RE | Admit: 2024-04-30 | Discharge: 2024-04-30 | Disposition: A | Source: Ambulatory Visit | Attending: Family Medicine | Admitting: Family Medicine

## 2024-04-30 DIAGNOSIS — R928 Other abnormal and inconclusive findings on diagnostic imaging of breast: Secondary | ICD-10-CM

## 2024-04-30 DIAGNOSIS — R921 Mammographic calcification found on diagnostic imaging of breast: Secondary | ICD-10-CM

## 2024-05-03 ENCOUNTER — Encounter

## 2024-05-03 LAB — SURGICAL PATHOLOGY

## 2024-05-11 ENCOUNTER — Ambulatory Visit: Payer: Self-pay | Admitting: General Surgery

## 2024-05-11 DIAGNOSIS — N6489 Other specified disorders of breast: Secondary | ICD-10-CM
